# Patient Record
Sex: Male | Born: 1965 | Race: White | Hispanic: No | Marital: Married | Smoking: Former smoker
Health system: Southern US, Community
[De-identification: ages and names within clinical notes are randomized; demographics above are authoritative.]

## PROBLEM LIST (undated history)

## (undated) DIAGNOSIS — F329 Major depressive disorder, single episode, unspecified: Secondary | ICD-10-CM

## (undated) DIAGNOSIS — F32A Depression, unspecified: Secondary | ICD-10-CM

## (undated) DIAGNOSIS — M1990 Unspecified osteoarthritis, unspecified site: Secondary | ICD-10-CM

## (undated) DIAGNOSIS — F419 Anxiety disorder, unspecified: Secondary | ICD-10-CM

## (undated) DIAGNOSIS — Z86010 Personal history of colonic polyps: Secondary | ICD-10-CM

## (undated) HISTORY — PX: UPPER GASTROINTESTINAL ENDOSCOPY: SHX188

## (undated) HISTORY — DX: Personal history of colonic polyps: Z86.010

## (undated) HISTORY — PX: UPPER GI ENDOSCOPY: SHX6162

## (undated) HISTORY — PX: MOUTH SURGERY: SHX715

## (undated) HISTORY — PX: COLONOSCOPY: SHX174

## (undated) HISTORY — PX: REPLACEMENT TOTAL KNEE: SUR1224

---

## 1997-01-18 HISTORY — PX: KNEE ARTHROSCOPY: SUR90

## 1998-03-26 ENCOUNTER — Encounter: Admission: RE | Admit: 1998-03-26 | Discharge: 1998-06-24 | Payer: Self-pay | Admitting: Orthopedic Surgery

## 2003-01-19 HISTORY — PX: NASAL SEPTUM SURGERY: SHX37

## 2004-09-24 ENCOUNTER — Ambulatory Visit: Payer: Self-pay | Admitting: Cardiology

## 2005-05-03 ENCOUNTER — Ambulatory Visit (HOSPITAL_BASED_OUTPATIENT_CLINIC_OR_DEPARTMENT_OTHER): Admission: RE | Admit: 2005-05-03 | Discharge: 2005-05-03 | Payer: Self-pay | Admitting: Orthopedic Surgery

## 2007-08-09 ENCOUNTER — Emergency Department (HOSPITAL_COMMUNITY): Admission: EM | Admit: 2007-08-09 | Discharge: 2007-08-09 | Payer: Self-pay | Admitting: Emergency Medicine

## 2010-06-05 NOTE — Op Note (Signed)
NAME:  Manuel Galloway, Manuel Galloway                  ACCOUNT NO.:  [id number]   MEDICAL RECORD NO.:  [id number]          PATIENT TYPE:  AMB   LOCATION:  DSC                          FACILITY:  [facility]   PHYSICIAN:  Feliberto Gottron. Turner Daniels, M.D.   DATE OF BIRTH:  [birth year]   DATE OF PROCEDURE:  05/03/2005  DATE OF DISCHARGE:                                 OPERATIVE REPORT   PREOPERATIVE DIAGNOSIS:  Left knee chondromalacia patella, possible medial  meniscal tear.   POSTOPERATIVE DIAGNOSIS:  Left knee chondromalacia patella grade 3, trochlea  grade 3 to grade 4, medial femoral condyle grade 3 to grade 4 with a large  posterior flap tear, large parrot beak tear the posterior horn medial  meniscus grade 3 chondromalacia lateral tibial condyle as well as some minor  degenerative tearing of the lateral meniscus.   PROCEDURE:  Left knee arthroscopic debridement chondromalacia and removal of  large parrot beak tear that was coming through the notch of the left knee  and also another finding was a partial tear the ACL.   SURGEON:  Feliberto Gottron. Turner Daniels, MD   FIRST ASSISTANT:  Skip Mayer, PA-C.   ANESTHETIC:  General LMA.   ESTIMATED BLOOD LOSS:  Minimal.   FLUID REPLACEMENT:  700 mL crystalloid.   DRAINS PLACED:  None.   TOURNIQUET TIME:  None.   INDICATIONS FOR PROCEDURE:  45 year old male with 23-month history of  catching, popping and pain in his left knee that is debilitating and he has  also had a chronic effusion for the last couple of weeks despite  conservative treatment.  He desires elective arthroscopic evaluation and  treatment of his left knee.  Risks, benefits of surgery discussed and he is  prepared for surgical intervention.  Questions were answered.   DESCRIPTION OF PROCEDURE:  The patient identified by armband, taken the  operating room at [facility].  Appropriate anesthetic monitors  were attached and general LMA anesthesia induced with the patient in supine  position.  Lateral  post applied to the table, left lower extremity prepped  and draped in a sterile fashion from the ankle to the midthigh and using a  #11 blade standard inferomedial and inferolateral peripatellar portals were  then made allowing introduction of the arthroscope through the inferolateral  portal and the outflow through the inferomedial portal.  Diagnostic  arthroscopy revealed fairly extensive chondromalacia patella grade 3 flap  tears and this was debrided back to stable margin with 3.5 gator sucker  shaver as were similar findings of the trochlea.  Moving in the medial  compartment a large parrot beak tear of the medial meniscus was encountered  and debrided back to stable margin with a 3.5 gator sucker shaver and a  straight biters as were large flap tears of the posterior aspect of the  medial femoral condyle.  The ACL was noted to be somewhat attenuated but  intact.  Going to the lateral side, grade 3 chondromalacia of was also noted  to lateral tibial condyle and this was debrided.  The distal aspect of the  medial  femoral condyle also had some significant chondromalacia grade 3 and  some focal grade 4 and there were multiple cartilaginous loose bodies found  in the joint fluid that were taken through the outflow.  At this point the  knee was irrigated out with normal saline solution.  The arthroscopic  instruments were removed and a dressing of Xeroform, 4x4 dressing sponges,  Webril and Ace wrap applied.  The patient was then awakened and taken to the  recovery room without difficulty.      Feliberto Gottron. Turner Daniels, M.D.  Electronically Signed     FJR/MEDQ  D:  05/03/2005  T:  05/04/2005  Job:  [id number]

## 2011-05-09 ENCOUNTER — Other Ambulatory Visit: Payer: Self-pay | Admitting: Orthopedic Surgery

## 2011-05-09 MED ORDER — DEXAMETHASONE SODIUM PHOSPHATE 10 MG/ML IJ SOLN: 10.0000 mg | Freq: Once | INTRAMUSCULAR | Status: DC

## 2011-05-09 MED ORDER — BUPIVACAINE 0.25 % ON-Q PUMP SINGLE CATH 300ML: 300.0000 mL | INJECTION | Status: DC

## 2011-07-02 ENCOUNTER — Encounter (HOSPITAL_COMMUNITY): Payer: Self-pay | Admitting: Pharmacy Technician

## 2011-07-06 ENCOUNTER — Encounter (HOSPITAL_COMMUNITY): Payer: Self-pay

## 2011-07-06 ENCOUNTER — Encounter (HOSPITAL_COMMUNITY)
Admission: RE | Admit: 2011-07-06 | Discharge: 2011-07-06 | Disposition: A | Discharge status: Home / Self Care | Payer: BC Managed Care – PPO | Source: Ambulatory Visit | Attending: Orthopedic Surgery | Admitting: Orthopedic Surgery

## 2011-07-06 HISTORY — DX: Unspecified osteoarthritis, unspecified site: M19.90

## 2011-07-06 LAB — URINALYSIS, ROUTINE W REFLEX MICROSCOPIC
Ketones, ur: NEGATIVE mg/dL
Leukocytes, UA: NEGATIVE
Nitrite: NEGATIVE
pH: 6.5 (ref 5.0–8.0)

## 2011-07-06 LAB — CBC
MCH: 30.6 pg (ref 26.0–34.0)
MCHC: 35.1 g/dL (ref 30.0–36.0)
MCV: 87 fL (ref 78.0–100.0)
Platelets: 183 10*3/uL (ref 150–400)
RDW: 12.5 % (ref 11.5–15.5)

## 2011-07-06 LAB — COMPREHENSIVE METABOLIC PANEL
AST: 28 U/L (ref 0–37)
Albumin: 4.3 g/dL (ref 3.5–5.2)
Calcium: 9.5 mg/dL (ref 8.4–10.5)
Creatinine, Ser: 0.85 mg/dL (ref 0.50–1.35)
GFR calc non Af Amer: >90 mL/min (ref >90)

## 2011-07-06 LAB — PROTIME-INR: INR: 0.98 (ref 0.00–1.49)

## 2011-07-06 NOTE — Pre-Procedure Instructions (Signed)
Reviewed pre-op instructions using teach back method.

## 2011-07-06 NOTE — Patient Instructions (Addendum)
20 Michaelangelo Mittelman [facility]  07/06/2011   Your procedure is scheduled on:  Wednesday 07/14/2011 at 230pm  Report to [facility] at 12 noon  Call this number if you have problems the morning of surgery: [phone]   Remember:   Do not eat food:After Midnight.  May have clear liquids: up to 4 Hours before arrival-MAY HAVE CLEAR LIQUIDS FROM MIDNIGHT UP UNTIL 830AM THEN NOTHING UNTIL AFTER SURGERY!  Clear liquids include soda, tea, black coffee, apple or grape juice, broth.  Take these medicines the morning of surgery with A SIP OF WATER: Paxil   Do not wear jewelry  Do not wear lotions, powders, or perfumes.  Do not shave 48 hours prior to surgery. Men may shave face and neck.  Do not bring valuables to the hospital.  Contacts, dentures or bridgework may not be worn into surgery.  Leave suitcase in the car. After surgery it may be brought to your room.  For patients admitted to the hospital, checkout time is 11:00 AM the day of discharge.       Special Instructions: CHG Shower Use Special Wash: 1/2 bottle night before surgery and 1/2 bottle morning of surgery.   Please read over the following fact sheets that you were given: MRSA Information, Blood fact sheet, Incentive Spirometry sheet, Sleep apnea sheet

## 2011-07-13 ENCOUNTER — Other Ambulatory Visit: Payer: Self-pay | Admitting: Orthopedic Surgery

## 2011-07-13 NOTE — H&P (Signed)
Manuel Galloway  DOB: 07/07/1965 Married / Language: Undefined / Race: Undefined Male  Date of Admission:  07/13/2011  Chief Complaint:  Left Knee Pain  History of Present Illness The patient is a 46 year old male who comes in for a preoperative History and Physical. The patient is scheduled for a left total knee arthroplasty to be performed by Dr. Frank V. Aluisio, MD at [facility] on 07/14/2011. The patient is a 46 year old male who presents today for follow up of their knee. The patient is being followed for their left knee pain. Symptoms reported today include: pain. The patient feels that they are doing poorly and report their pain level to be mild. Current treatment includes: NSAIDs and cortizone injection. The following medication has been used for pain control: antiinflammatory medication. The patient has reported improvement of their symptoms with: conservative measures and Cortisone injections. The patient indicates that they have questions or concerns today regarding pain and their progress at this point. Note for "Follow-up Knee": cortizone injection. He states he may have had a little improvement from injection although he still has pain. He says the pain radiates laterally down to his foot from the knee. He has no locking or instability. He says that the pain comes after about 20 mins. of walking, standing, etc. They have been treated conservatively in the past for the above stated problem and despite conservative measures, they continue to have progressive pain and severe functional limitations and dysfunction. They have failed non-operative management including home exercise, medications, and injections. It is felt that they would benefit from undergoing total joint replacement. Risks and benefits of the procedure have been discussed with the patient and they elect to proceed with surgery. There are no active contraindications to surgery such as ongoing infection or rapidly  progressive neurological disease.   Problem List/Past Medical Osteoarthrosis NOS, lower leg (715.96). 08/07/2010 Anxiety Disorder Depression Osteoarthritis Eczema  Allergies Morphine Derivatives. Mild itching   Family History Cancer. father   Social History Alcohol use. current drinker; drinks beer; 5-7 per week Children. 3 Current work status. working full time Drug/Alcohol Rehab (Currently). no Drug/Alcohol Rehab (Previously). no Exercise. Exercises rarely; does running / walking Illicit drug use. no Living situation. live with spouse Marital status. married Number of flights of stairs before winded. 4-5 Pain Contract. no Tobacco / smoke exposure. no Tobacco use. Never smoker. former smoker; smoke(d) 1 pack(s) per day Post-Surgical Plans. Plan is to go home after surgery.   Medication History Paxil (10MG Tablet, Oral daily) Active.   Past Surgical History Arthroscopy of Knee. bilateral Straighten Nasal Septum   Review of Systems General:Not Present- Chills, Fever, Night Sweats, Appetite Loss, Fatigue, Feeling sick, Weight Gain and Weight Loss. Skin:Not Present- Itching, Rash, Skin Color Changes, Ulcer, Psoriasis and Change in Hair or Nails. HEENT:Not Present- Sensitivity to light, Hearing problems, Nose Bleed and Ringing in the Ears. Neck:Not Present- Swollen Glands and Neck Mass. Respiratory:Not Present- Snoring, Chronic Cough, Bloody sputum and Dyspnea. Cardiovascular:Present- Leg Cramps. Not Present- Shortness of Breath, Chest Pain, Swelling of Extremities and Palpitations. Gastrointestinal:Not Present- Bloody Stool, Heartburn, Abdominal Pain, Vomiting, Nausea and Incontinence of Stool. Male Genitourinary:Not Present- Blood in Urine, Frequency, Incontinence and Nocturia. Musculoskeletal:Present- Muscle Weakness, Joint Stiffness, Joint Swelling and Joint Pain. Not Present- Muscle Pain and Back Pain. Neurological:Present-  Tingling. Not Present- Numbness, Burning, Tremor, Headaches and Dizziness. Psychiatric:Not Present- Anxiety, Depression and Memory Loss. Endocrine:Not Present- Cold Intolerance, Heat Intolerance, Excessive hunger and Excessive Thirst. Hematology:Not   Present- Abnormal Bleeding, Anemia, Blood Clots and Easy Bruising.   Vitals Weight: 265 lb Height: 75 in Body Surface Area: 2.52 m Body Mass Index: 33.12 kg/m Pulse: 76 (Regular) Resp.: 14 (Unlabored) BP: 118/82 (Sitting, Right Arm, Standard)   Physical Exam The physical exam findings are as follows: Patient is a tall framed male with continued knee pain.   General Mental Status - Alert, cooperative and good historian. General Appearance- pleasant. Not in acute distress. Orientation- Oriented X3. Build & Nutrition- Well nourished and Well developed.   Head and Neck Head- normocephalic, atraumatic . Neck Global Assessment- supple. no bruit auscultated on the right and no bruit auscultated on the left.   Eye Pupil- Bilateral- Regular and Round. Motion- Bilateral- EOMI. wears contacts and glasses  Chest and Lung Exam Auscultation: Breath sounds:- clear at anterior chest wall and - clear at posterior chest wall. Adventitious sounds:- No Adventitious sounds.   Cardiovascular Auscultation:Rhythm- Regular rate and rhythm. Heart Sounds- S1 WNL and S2 WNL. Murmurs & Other Heart Sounds:Auscultation of the heart reveals - No Murmurs.   Abdomen Palpation/Percussion:Tenderness- Abdomen is non-tender to palpation. Rigidity (guarding)- Abdomen is soft. Auscultation:Auscultation of the abdomen reveals - Bowel sounds normal.   Male Genitourinary Not done, not pertinent to present illness  Musculoskeletal Both hips have normal range of motion with no discomfort.  Right knee shows no effusion. There is no deformity on the right. Range of motion is five to 125 degrees. Slight crepitus on  range of motion. There is no instability or tenderness.  Left knee shows no effusion. Range of motion is 10 to 120 degrees. Marked crepitus on range of motion. Tenderness medial greater than lateral. There is no instability noted. Pulses, sensation and motor are intact.  RADIOGRAPHS: Radiographs are reviewed from last visit AP and lateral of the knee showing severe endstage tricompartmental arthritis of the left knee bone-on-bone all three compartments worse lateral and patellofemoral. There is no significant angular deformity but all three compartments are involved.  Assessment & Plan Osteoarthritis Left Knee  Note: Patient is for a Left Total Knee Replacement for Dr. Aluisio.  Plan is to go home after the hospital stay.  Signed electronically by DREW L Anden Bartolo, PA-C  

## 2011-07-14 ENCOUNTER — Inpatient Hospital Stay (HOSPITAL_COMMUNITY)
Admission: RE | Admit: 2011-07-14 | Discharge: 2011-07-16 | DRG: 209 | Disposition: A | Discharge status: Home Health | Payer: BC Managed Care – PPO | Source: Ambulatory Visit | Attending: Orthopedic Surgery | Admitting: Orthopedic Surgery

## 2011-07-14 ENCOUNTER — Ambulatory Visit (HOSPITAL_COMMUNITY): Payer: BC Managed Care – PPO | Admitting: *Deleted

## 2011-07-14 ENCOUNTER — Encounter (HOSPITAL_COMMUNITY): Payer: Self-pay | Admitting: *Deleted

## 2011-07-14 ENCOUNTER — Encounter (HOSPITAL_COMMUNITY)
Admission: RE | Disposition: A | Discharge status: Home Health | Payer: Self-pay | Source: Ambulatory Visit | Attending: Orthopedic Surgery

## 2011-07-14 DIAGNOSIS — Z96659 Presence of unspecified artificial knee joint: Secondary | ICD-10-CM

## 2011-07-14 DIAGNOSIS — E876 Hypokalemia: Secondary | ICD-10-CM

## 2011-07-14 DIAGNOSIS — Z01812 Encounter for preprocedural laboratory examination: Secondary | ICD-10-CM

## 2011-07-14 DIAGNOSIS — E871 Hypo-osmolality and hyponatremia: Secondary | ICD-10-CM

## 2011-07-14 DIAGNOSIS — M1710 Unilateral primary osteoarthritis, unspecified knee: Secondary | ICD-10-CM | POA: Diagnosis present

## 2011-07-14 HISTORY — PX: TOTAL KNEE ARTHROPLASTY: SHX125

## 2011-07-14 LAB — TYPE AND SCREEN

## 2011-07-14 SURGERY — ARTHROPLASTY, KNEE, TOTAL
Anesthesia: General | Site: Knee | Laterality: Left | Wound class: Clean

## 2011-07-14 MED ORDER — MORPHINE SULFATE (PF) 1 MG/ML IV SOLN
INTRAVENOUS | Status: AC
  Administered 2011-07-14: 16:00:00
  Filled 2011-07-14: qty 25

## 2011-07-14 MED ORDER — MORPHINE SULFATE (PF) 1 MG/ML IV SOLN
INTRAVENOUS | Status: DC
  Administered 2011-07-14: 19 mg via INTRAVENOUS
  Administered 2011-07-14: 20:00:00 via INTRAVENOUS
  Administered 2011-07-15: 13 mg via INTRAVENOUS
  Administered 2011-07-15: 1 mg via INTRAVENOUS
  Filled 2011-07-14: qty 25

## 2011-07-14 MED ORDER — ONDANSETRON HCL 4 MG PO TABS: 4.0000 mg | ORAL_TABLET | Freq: Four times a day (QID) | ORAL | Status: DC | PRN

## 2011-07-14 MED ORDER — CEFAZOLIN SODIUM-DEXTROSE 2-3 GM-% IV SOLR
2.0000 g | INTRAVENOUS | Status: AC
  Administered 2011-07-14: 2 g via INTRAVENOUS

## 2011-07-14 MED ORDER — BUPIVACAINE 0.25 % ON-Q PUMP SINGLE CATH 300ML
INJECTION | Status: AC
  Filled 2011-07-14: qty 300

## 2011-07-14 MED ORDER — ACETAMINOPHEN 10 MG/ML IV SOLN
1000.0000 mg | Freq: Four times a day (QID) | INTRAVENOUS | Status: AC
  Administered 2011-07-14 – 2011-07-15 (×4): 1000 mg via INTRAVENOUS
  Filled 2011-07-14 (×4): qty 100

## 2011-07-14 MED ORDER — HYDROMORPHONE HCL PF 1 MG/ML IJ SOLN
INTRAMUSCULAR | Status: AC
  Filled 2011-07-14: qty 1

## 2011-07-14 MED ORDER — SODIUM CHLORIDE 0.9 % IJ SOLN: 9.0000 mL | INTRAMUSCULAR | Status: DC | PRN

## 2011-07-14 MED ORDER — CEFAZOLIN SODIUM-DEXTROSE 2-3 GM-% IV SOLR
INTRAVENOUS | Status: AC
  Filled 2011-07-14: qty 50

## 2011-07-14 MED ORDER — METHOCARBAMOL 100 MG/ML IJ SOLN
500.0000 mg | Freq: Four times a day (QID) | INTRAVENOUS | Status: DC | PRN
  Administered 2011-07-14 (×2): 500 mg via INTRAVENOUS
  Filled 2011-07-14 (×2): qty 5

## 2011-07-14 MED ORDER — ONDANSETRON HCL 4 MG/2ML IJ SOLN
4.0000 mg | Freq: Four times a day (QID) | INTRAMUSCULAR | Status: DC | PRN
Start: 2011-07-14 — End: 2011-07-14

## 2011-07-14 MED ORDER — DIPHENHYDRAMINE HCL 50 MG/ML IJ SOLN: 12.5000 mg | Freq: Four times a day (QID) | INTRAMUSCULAR | Status: DC | PRN

## 2011-07-14 MED ORDER — MENTHOL 3 MG MT LOZG
1.0000 | LOZENGE | OROMUCOSAL | Status: DC | PRN
  Filled 2011-07-14: qty 9

## 2011-07-14 MED ORDER — MIDAZOLAM HCL 5 MG/5ML IJ SOLN
INTRAMUSCULAR | Status: DC | PRN
  Administered 2011-07-14: 2 mg via INTRAVENOUS

## 2011-07-14 MED ORDER — TRAMADOL HCL 50 MG PO TABS
50.0000 mg | ORAL_TABLET | Freq: Four times a day (QID) | ORAL | Status: DC | PRN
  Administered 2011-07-15: 100 mg via ORAL
  Filled 2011-07-14: qty 2

## 2011-07-14 MED ORDER — DEXTROSE-NACL 5-0.9 % IV SOLN
INTRAVENOUS | Status: DC
  Administered 2011-07-15: 01:00:00 via INTRAVENOUS

## 2011-07-14 MED ORDER — DIPHENHYDRAMINE HCL 12.5 MG/5ML PO ELIX: 12.5000 mg | ORAL_SOLUTION | Freq: Four times a day (QID) | ORAL | Status: DC | PRN

## 2011-07-14 MED ORDER — NALOXONE HCL 0.4 MG/ML IJ SOLN: 0.4000 mg | INTRAMUSCULAR | Status: DC | PRN

## 2011-07-14 MED ORDER — ACETAMINOPHEN 325 MG PO TABS: 650.0000 mg | ORAL_TABLET | Freq: Four times a day (QID) | ORAL | Status: DC | PRN

## 2011-07-14 MED ORDER — HYDROMORPHONE HCL PF 1 MG/ML IJ SOLN
0.2500 mg | INTRAMUSCULAR | Status: DC | PRN
  Administered 2011-07-14 (×4): 0.5 mg via INTRAVENOUS

## 2011-07-14 MED ORDER — PROPOFOL 10 MG/ML IV BOLUS
INTRAVENOUS | Status: DC | PRN
  Administered 2011-07-14 (×4): 20 mg via INTRAVENOUS

## 2011-07-14 MED ORDER — POLYETHYLENE GLYCOL 3350 17 G PO PACK: 17.0000 g | PACK | Freq: Every day | ORAL | Status: DC | PRN

## 2011-07-14 MED ORDER — FENTANYL CITRATE 0.05 MG/ML IJ SOLN
INTRAMUSCULAR | Status: DC | PRN
  Administered 2011-07-14: 100 ug via INTRAVENOUS
  Administered 2011-07-14 (×3): 50 ug via INTRAVENOUS

## 2011-07-14 MED ORDER — ACETAMINOPHEN 10 MG/ML IV SOLN
1000.0000 mg | Freq: Once | INTRAVENOUS | Status: AC
  Administered 2011-07-14: 1000 mg via INTRAVENOUS

## 2011-07-14 MED ORDER — ONDANSETRON HCL 4 MG/2ML IJ SOLN
INTRAMUSCULAR | Status: DC | PRN
  Administered 2011-07-14: 4 mg via INTRAVENOUS

## 2011-07-14 MED ORDER — ACETAMINOPHEN 10 MG/ML IV SOLN
INTRAVENOUS | Status: AC
  Filled 2011-07-14: qty 100

## 2011-07-14 MED ORDER — LACTATED RINGERS IV SOLN
INTRAVENOUS | Status: DC
  Administered 2011-07-14 (×2): via INTRAVENOUS
  Administered 2011-07-14: 1000 mL via INTRAVENOUS

## 2011-07-14 MED ORDER — DEXAMETHASONE SODIUM PHOSPHATE 4 MG/ML IJ SOLN
INTRAMUSCULAR | Status: DC | PRN
  Administered 2011-07-14: 10 mg via INTRAVENOUS

## 2011-07-14 MED ORDER — BUPIVACAINE IN DEXTROSE 0.75-8.25 % IT SOLN
INTRATHECAL | Status: DC | PRN
  Administered 2011-07-14: 10 mg via INTRATHECAL

## 2011-07-14 MED ORDER — METOCLOPRAMIDE HCL 10 MG PO TABS: 5.0000 mg | ORAL_TABLET | Freq: Three times a day (TID) | ORAL | Status: DC | PRN

## 2011-07-14 MED ORDER — TEMAZEPAM 15 MG PO CAPS
15.0000 mg | ORAL_CAPSULE | Freq: Every evening | ORAL | Status: DC | PRN
  Administered 2011-07-14: 15 mg via ORAL
  Filled 2011-07-14: qty 1

## 2011-07-14 MED ORDER — CHLORHEXIDINE GLUCONATE 4 % EX LIQD: 60.0000 mL | Freq: Once | CUTANEOUS | Status: DC

## 2011-07-14 MED ORDER — SODIUM CHLORIDE 0.9 % IR SOLN
Status: DC | PRN
  Administered 2011-07-14: 1000 mL

## 2011-07-14 MED ORDER — PROPOFOL 10 MG/ML IV EMUL
INTRAVENOUS | Status: DC | PRN
  Administered 2011-07-14: 100 ug/kg/min via INTRAVENOUS

## 2011-07-14 MED ORDER — BUPIVACAINE HCL (PF) 0.25 % IJ SOLN
INTRAMUSCULAR | Status: DC | PRN
  Administered 2011-07-14: 300 mL via INTRA_ARTICULAR

## 2011-07-14 MED ORDER — METHOCARBAMOL 500 MG PO TABS
500.0000 mg | ORAL_TABLET | Freq: Four times a day (QID) | ORAL | Status: DC | PRN
  Administered 2011-07-15 – 2011-07-16 (×5): 500 mg via ORAL
  Filled 2011-07-14 (×5): qty 1

## 2011-07-14 MED ORDER — SODIUM CHLORIDE 0.9 % IV SOLN: INTRAVENOUS | Status: DC

## 2011-07-14 MED ORDER — ACETAMINOPHEN 650 MG RE SUPP: 650.0000 mg | Freq: Four times a day (QID) | RECTAL | Status: DC | PRN

## 2011-07-14 MED ORDER — METOCLOPRAMIDE HCL 5 MG/ML IJ SOLN: 5.0000 mg | Freq: Three times a day (TID) | INTRAMUSCULAR | Status: DC | PRN

## 2011-07-14 MED ORDER — DOCUSATE SODIUM 100 MG PO CAPS
100.0000 mg | ORAL_CAPSULE | Freq: Two times a day (BID) | ORAL | Status: DC
  Administered 2011-07-14 – 2011-07-16 (×4): 100 mg via ORAL

## 2011-07-14 MED ORDER — FLEET ENEMA 7-19 GM/118ML RE ENEM: 1.0000 | ENEMA | Freq: Once | RECTAL | Status: AC | PRN

## 2011-07-14 MED ORDER — CEFAZOLIN SODIUM 1-5 GM-% IV SOLN
1.0000 g | Freq: Four times a day (QID) | INTRAVENOUS | Status: AC
  Administered 2011-07-14 – 2011-07-15 (×2): 1 g via INTRAVENOUS
  Filled 2011-07-14 (×2): qty 50

## 2011-07-14 MED ORDER — RIVAROXABAN 10 MG PO TABS
10.0000 mg | ORAL_TABLET | Freq: Every day | ORAL | Status: DC
  Administered 2011-07-15 – 2011-07-16 (×2): 10 mg via ORAL
  Filled 2011-07-14 (×3): qty 1

## 2011-07-14 MED ORDER — PHENOL 1.4 % MT LIQD
1.0000 | OROMUCOSAL | Status: DC | PRN
  Filled 2011-07-14: qty 177

## 2011-07-14 MED ORDER — DIPHENHYDRAMINE HCL 12.5 MG/5ML PO ELIX
12.5000 mg | ORAL_SOLUTION | ORAL | Status: DC | PRN
  Administered 2011-07-14 – 2011-07-15 (×2): 25 mg via ORAL
  Administered 2011-07-15: 12.5 mg via ORAL
  Administered 2011-07-15: 25 mg via ORAL
  Administered 2011-07-15 – 2011-07-16 (×2): 12.5 mg via ORAL
  Administered 2011-07-16: 25 mg via ORAL
  Filled 2011-07-14 (×2): qty 10
  Filled 2011-07-14: qty 5
  Filled 2011-07-14 (×2): qty 10
  Filled 2011-07-14: qty 5
  Filled 2011-07-14: qty 10

## 2011-07-14 MED ORDER — PAROXETINE HCL 20 MG PO TABS
20.0000 mg | ORAL_TABLET | Freq: Every day | ORAL | Status: DC
  Administered 2011-07-15 – 2011-07-16 (×2): 20 mg via ORAL
  Filled 2011-07-14 (×2): qty 1

## 2011-07-14 MED ORDER — ONDANSETRON HCL 4 MG/2ML IJ SOLN: 4.0000 mg | Freq: Four times a day (QID) | INTRAMUSCULAR | Status: DC | PRN

## 2011-07-14 MED ORDER — BISACODYL 10 MG RE SUPP: 10.0000 mg | Freq: Every day | RECTAL | Status: DC | PRN

## 2011-07-14 MED ORDER — OXYCODONE HCL 5 MG PO TABS
5.0000 mg | ORAL_TABLET | ORAL | Status: DC | PRN
  Administered 2011-07-14 – 2011-07-16 (×13): 10 mg via ORAL
  Filled 2011-07-14 (×13): qty 2

## 2011-07-14 MED ORDER — BUPIVACAINE ON-Q PAIN PUMP (FOR ORDER SET NO CHG)
INJECTION | Status: DC
  Filled 2011-07-14: qty 1

## 2011-07-14 SURGICAL SUPPLY — 59 items
BAG SPEC THK2 15X12 ZIP CLS (MISCELLANEOUS) ×1
BAG ZIPLOCK 12X15 (MISCELLANEOUS) ×2 IMPLANT
BANDAGE ELASTIC 6 VELCRO ST LF (GAUZE/BANDAGES/DRESSINGS) ×2 IMPLANT
BANDAGE ESMARK 6X9 LF (GAUZE/BANDAGES/DRESSINGS) ×1 IMPLANT
BLADE SAG 18X100X1.27 (BLADE) ×2 IMPLANT
BLADE SAW SGTL 11.0X1.19X90.0M (BLADE) ×2 IMPLANT
BNDG CMPR 9X6 STRL LF SNTH (GAUZE/BANDAGES/DRESSINGS) ×1
BNDG ESMARK 6X9 LF (GAUZE/BANDAGES/DRESSINGS) ×2
BOWL SMART MIX CTS (DISPOSABLE) ×2 IMPLANT
CATH KIT ON-Q SILVERSOAK 5 (CATHETERS) ×1 IMPLANT
CATH KIT ON-Q SILVERSOAK 5IN (CATHETERS) ×2 IMPLANT
CEMENT HV SMART SET (Cement) ×4 IMPLANT
CLOSURE STERI-STRIP 1/4X4 (GAUZE/BANDAGES/DRESSINGS) ×1 IMPLANT
CLOTH BEACON ORANGE TIMEOUT ST (SAFETY) ×2 IMPLANT
CUFF TOURN SGL QUICK 34 (TOURNIQUET CUFF) ×2
CUFF TRNQT CYL 34X4X40X1 (TOURNIQUET CUFF) ×1 IMPLANT
DRAPE EXTREMITY T 121X128X90 (DRAPE) ×2 IMPLANT
DRAPE POUCH INSTRU U-SHP 10X18 (DRAPES) ×2 IMPLANT
DRAPE U-SHAPE 47X51 STRL (DRAPES) ×2 IMPLANT
DRSG ADAPTIC 3X8 NADH LF (GAUZE/BANDAGES/DRESSINGS) ×2 IMPLANT
DRSG PAD ABDOMINAL 8X10 ST (GAUZE/BANDAGES/DRESSINGS) ×1 IMPLANT
DURAPREP 26ML APPLICATOR (WOUND CARE) ×2 IMPLANT
ELECT REM PT RETURN 9FT ADLT (ELECTROSURGICAL) ×2
ELECTRODE REM PT RTRN 9FT ADLT (ELECTROSURGICAL) ×1 IMPLANT
EVACUATOR 1/8 PVC DRAIN (DRAIN) ×2 IMPLANT
FACESHIELD LNG OPTICON STERILE (SAFETY) ×10 IMPLANT
GLOVE BIO SURGEON STRL SZ7.5 (GLOVE) ×4 IMPLANT
GLOVE BIO SURGEON STRL SZ8 (GLOVE) ×2 IMPLANT
GLOVE BIOGEL PI IND STRL 7.5 (GLOVE) IMPLANT
GLOVE BIOGEL PI IND STRL 8 (GLOVE) ×2 IMPLANT
GLOVE BIOGEL PI INDICATOR 7.5 (GLOVE) ×2
GLOVE BIOGEL PI INDICATOR 8 (GLOVE) ×2
GOWN STRL NON-REIN LRG LVL3 (GOWN DISPOSABLE) ×4 IMPLANT
GOWN STRL REIN XL XLG (GOWN DISPOSABLE) ×2 IMPLANT
HANDPIECE INTERPULSE COAX TIP (DISPOSABLE) ×2
IMMOBILIZER KNEE 20 (SOFTGOODS) ×2
IMMOBILIZER KNEE 20 THIGH 36 (SOFTGOODS) ×1 IMPLANT
KIT BASIN OR (CUSTOM PROCEDURE TRAY) ×2 IMPLANT
MANIFOLD NEPTUNE II (INSTRUMENTS) ×2 IMPLANT
NS IRRIG 1000ML POUR BTL (IV SOLUTION) ×2 IMPLANT
PACK TOTAL JOINT (CUSTOM PROCEDURE TRAY) ×2 IMPLANT
PAD ABD 7.5X8 STRL (GAUZE/BANDAGES/DRESSINGS) ×2 IMPLANT
PADDING CAST ABS 6INX4YD NS (CAST SUPPLIES) ×1
PADDING CAST ABS COTTON 6X4 NS (CAST SUPPLIES) IMPLANT
PADDING CAST COTTON 6X4 STRL (CAST SUPPLIES) ×6 IMPLANT
POSITIONER SURGICAL ARM (MISCELLANEOUS) ×2 IMPLANT
SET HNDPC FAN SPRY TIP SCT (DISPOSABLE) ×1 IMPLANT
SPONGE GAUZE 4X4 12PLY (GAUZE/BANDAGES/DRESSINGS) ×2 IMPLANT
STRIP CLOSURE SKIN 1/2X4 (GAUZE/BANDAGES/DRESSINGS) ×4 IMPLANT
SUCTION FRAZIER 12FR DISP (SUCTIONS) ×2 IMPLANT
SUT MNCRL AB 4-0 PS2 18 (SUTURE) ×2 IMPLANT
SUT PDS AB 1 CT1 27 (SUTURE) ×6 IMPLANT
SUT VIC AB 2-0 CT1 27 (SUTURE) ×6
SUT VIC AB 2-0 CT1 TAPERPNT 27 (SUTURE) ×3 IMPLANT
SUT VLOC 180 0 24IN GS25 (SUTURE) ×2 IMPLANT
TOWEL OR 17X26 10 PK STRL BLUE (TOWEL DISPOSABLE) ×4 IMPLANT
TRAY FOLEY CATH 14FRSI W/METER (CATHETERS) ×2 IMPLANT
WATER STERILE IRR 1500ML POUR (IV SOLUTION) ×2 IMPLANT
WRAP KNEE MAXI GEL POST OP (GAUZE/BANDAGES/DRESSINGS) ×4 IMPLANT

## 2011-07-14 NOTE — Interval H&P Note (Signed)
History and Physical Interval Note:  07/14/2011 1:43 PM  Manuel Galloway  has presented today for surgery, with the diagnosis of Osteoarthritis of the Left Knee  The various methods of treatment have been discussed with the patient and family. After consideration of risks, benefits and other options for treatment, the patient has consented to  Procedure(s) (LRB): TOTAL KNEE ARTHROPLASTY (Left) as a surgical intervention .  The patient's history has been reviewed, patient examined, no change in status, stable for surgery.  I have reviewed the patients' chart and labs.  Questions were answered to the patient's satisfaction.     Loanne Drilling

## 2011-07-14 NOTE — Anesthesia Postprocedure Evaluation (Signed)
  Anesthesia Post-op Note  Patient: Manuel Galloway  Procedure(s) Performed: Procedure(s) (LRB): TOTAL KNEE ARTHROPLASTY (Left)  Patient Location: PACU  Anesthesia Type: Spinal  Level of Consciousness: oriented and sedated  Airway and Oxygen Therapy: Patient Spontanous Breathing and Patient connected to nasal cannula oxygen  Post-op Pain: mild  Post-op Assessment: Post-op Vital signs reviewed, Patient's Cardiovascular Status Stable, Respiratory Function Stable and Patent Airway  Post-op Vital Signs: stable  Complications: No apparent anesthesia complications

## 2011-07-14 NOTE — Progress Notes (Signed)
Patient states he wants something to help him sleep tonight. On call PA Kriste Basque paged with orders to give Restoril 15mg   qHS PRN for sleep.

## 2011-07-14 NOTE — OR Nursing (Signed)
Pre-procedural time performed at 1345

## 2011-07-14 NOTE — Op Note (Signed)
Pre-operative diagnosis- Osteoarthritis  Left knee(s)  Post-operative diagnosis- Osteoarthritis Left knee(s)  Procedure-  Left  Total Knee Arthroplasty  Surgeon- Gus Rankin. Neelie Welshans, MD  Assistant- Avel Peace, PA-C   Anesthesia-  Spinal EBL-* No blood loss amount entered *  Drains Hemovac  Tourniquet time-  Total Tourniquet Time Documented: Thigh (Left) - 41 minutes   Complications- None  Condition-PACU - hemodynamically stable.   Brief Clinical Note   Manuel Galloway is a 46 y.o. year old male with end stage OA of his left knee with progressively worsening pain and dysfunction. He has constant pain, with activity and at rest and significant functional deficits with difficulties even with ADLs. He has had extensive non-op management including analgesics, injections of cortisone and viscosupplements, and home exercise program, but remains in significant pain with significant dysfunction. Radiographs show bone on bone arthritis lateral and patellofemoral with tibial subluxation. He presents now for left Total Knee Arthroplasty.    Procedure in detail---   The patient is brought into the operating room and positioned supine on the operating table. After successful administration of  Spinal,   a tourniquet is placed high on the  Left thigh(s) and the lower extremity is prepped and draped in the usual sterile fashion. Time out is performed by the operating team and then the  Left lower extremity is wrapped in Esmarch, knee flexed and the tourniquet inflated to 300 mmHg.       A midline incision is made with a ten blade through the subcutaneous tissue to the level of the extensor mechanism. A fresh blade is used to make a medial parapatellar arthrotomy. Soft tissue over the proximal medial tibia is subperiosteally elevated to the joint line with a knife and into the semimembranosus bursa with a Cobb elevator. Soft tissue over the proximal lateral tibia is elevated with attention being paid to  avoiding the patellar tendon on the tibial tubercle. The patella is everted, knee flexed 90 degrees and the ACL and PCL are removed. Findings are bone on bone all 3 compartments with massive global osteophytes.        The drill is used to create a starting hole in the distal femur and the canal is thoroughly irrigated with sterile saline to remove the fatty contents. The 5 degree Left  valgus alignment guide is placed into the femoral canal and the distal femoral cutting block is pinned to remove 11 mm off the distal femur. Resection is made with an oscillating saw.      The tibia is subluxed forward and the menisci are removed. The extramedullary alignment guide is placed referencing proximally at the medial aspect of the tibial tubercle and distally along the second metatarsal axis and tibial crest. The block is pinned to remove 2mm off the more deficient lateral  side. Resection is made with an oscillating saw. Size 5is the most appropriate size for the tibia and the proximal tibia is prepared with the modular drill and keel punch for that size.      The femoral sizing guide is placed and size 6 is most appropriate. Rotation is marked off the epicondylar axis and confirmed by creating a rectangular flexion gap at 90 degrees. The size 6 cutting block is pinned in this rotation and the anterior, posterior and chamfer cuts are made with the oscillating saw. The intercondylar block is then placed and that cut is made.      Trial size 5 tibial component, trial size 6 posterior stabilized femur  and a 10  mm posterior stabilized rotating platform insert trial is placed. Full extension is achieved with excellent varus/valgus and anterior/posterior balance throughout full range of motion. The patella is everted and thickness measured to be 27  mm. Free hand resection is taken to 15 mm, a 41 template is placed, lug holes are drilled, trial patella is placed, and it tracks normally. Osteophytes are removed off the  posterior femur with the trial in place. All trials are removed and the cut bone surfaces prepared with pulsatile lavage. Cement is mixed and once ready for implantation, the size 5 tibial implant, size  6 posterior stabilized femoral component, and the size 41 patella are cemented in place and the patella is held with the clamp. The trial insert is placed and the knee held in full extension. All extruded cement is removed and once the cement is hard the permanent 10 mm posterior stabilized rotating platform insert is placed into the tibial tray.      The wound is copiously irrigated with saline solution and the extensor mechanism closed over a hemovac drain with #1 PDS suture. The tourniquet is released for a total tourniquet time of 41  minutes. Flexion against gravity is 140 degrees and the patella tracks normally. Subcutaneous tissue is closed with 2.0 vicryl and subcuticular with running 4.0 Monocryl. The catheter for the Marcaine pain pump is placed and the pump is initiated. The incision is cleaned and dried and steri-strips and a bulky sterile dressing are applied. The limb is placed into a knee immobilizer and the patient is awakened and transported to recovery in stable condition.      Please note that a surgical assistant was a medical necessity for this procedure in order to perform it in a safe and expeditious manner. Surgical assistant was necessary to retract the ligaments and vital neurovascular structures to prevent injury to them and also necessary for proper positioning of the limb to allow for anatomic placement of the prosthesis.   Gus Rankin Roshni Burbano, MD    07/14/2011, 3:02 PM

## 2011-07-14 NOTE — Transfer of Care (Signed)
Immediate Anesthesia Transfer of Care Note  Patient: Manuel Galloway [facility]  Procedure(s) Performed: Procedure(s) (LRB): TOTAL KNEE ARTHROPLASTY (Left)  Patient Location: PACU  Anesthesia Type: MAC and Spinal  Level of Consciousness: awake, alert  and patient cooperative  Airway & Oxygen Therapy: Patient Spontanous Breathing and Patient connected to face mask oxygen  Post-op Assessment: Report given to PACU RN, Post -op Vital signs reviewed and stable and Patient moving all extremities  Post vital signs: Reviewed and stable  Complications: No apparent anesthesia complications

## 2011-07-14 NOTE — Anesthesia Procedure Notes (Signed)
Spinal  Patient location during procedure: OR Start time: 07/14/2011 1:47 PM End time: 07/14/2011 1:57 PM Staffing Anesthesiologist: Lestine Box B Preanesthetic Checklist Completed: patient identified, site marked, surgical consent, pre-op evaluation, timeout performed, IV checked, risks and benefits discussed and monitors and equipment checked Spinal Block Patient position: sitting Prep: Betadine and site prepped and draped Patient monitoring: heart rate, cardiac monitor, continuous pulse ox and blood pressure Approach: midline Location: L3-4 Injection technique: single-shot Needle Needle type: Quincke  Needle gauge: 25 G Needle length: 10 cm Needle insertion depth: 4 cm Assessment Sensory level: T6 Additional Notes [phone]

## 2011-07-14 NOTE — Preoperative (Signed)
Beta Blockers   Reason not to administer Beta Blockers:Not Applicable 

## 2011-07-14 NOTE — Anesthesia Preprocedure Evaluation (Signed)
Anesthesia Evaluation  Patient identified by MRN, date of birth, ID band Patient awake    Reviewed: Allergy & Precautions, H&P , NPO status , Patient's Chart, lab work & pertinent test results, reviewed documented beta blocker date and time   Airway Mallampati: I TM Distance: >3 FB Neck ROM: Full    Dental  (+) Teeth Intact and Dental Advisory Given   Pulmonary neg pulmonary ROS,  breath sounds clear to auscultation        Cardiovascular negative cardio ROS  Rhythm:Regular Rate:Normal  Denies cardiac symptoms   Neuro/Psych negative neurological ROS  negative psych ROS   GI/Hepatic negative GI ROS, Neg liver ROS,   Endo/Other  negative endocrine ROS  Renal/GU negative Renal ROS  negative genitourinary   Musculoskeletal   Abdominal   Peds negative pediatric ROS (+)  Hematology negative hematology ROS (+)   Anesthesia Other Findings   Reproductive/Obstetrics negative OB ROS                           Anesthesia Physical Anesthesia Plan  ASA: I  Anesthesia Plan: General   Post-op Pain Management:    Induction: Intravenous  Airway Management Planned: Oral ETT  Additional Equipment:   Intra-op Plan:   Post-operative Plan: Extubation in OR  Informed Consent: I have reviewed the patients History and Physical, chart, labs and discussed the procedure including the risks, benefits and alternatives for the proposed anesthesia with the patient or authorized representative who has indicated his/her understanding and acceptance.   Dental advisory given  Plan Discussed with: CRNA and Surgeon  Anesthesia Plan Comments:         Anesthesia Quick Evaluation

## 2011-07-14 NOTE — H&P (View-Only) (Signed)
Manuel Galloway  DOB: 1965/05/26 Married / Language: Undefined / Race: Undefined Male  Date of Admission:  07/13/2011  Chief Complaint:  Left Knee Pain  History of Present Illness The patient is a 46 year old male who comes in for a preoperative History and Physical. The patient is scheduled for a left total knee arthroplasty to be performed by Dr. Gus Rankin. Aluisio, MD at [facility] on 07/14/2011. The patient is a 46 year old male who presents today for follow up of their knee. The patient is being followed for their left knee pain. Symptoms reported today include: pain. The patient feels that they are doing poorly and report their pain level to be mild. Current treatment includes: NSAIDs and cortizone injection. The following medication has been used for pain control: antiinflammatory medication. The patient has reported improvement of their symptoms with: conservative measures and Cortisone injections. The patient indicates that they have questions or concerns today regarding pain and their progress at this point. Note for "Follow-up Knee": cortizone injection. He states he may have had a little improvement from injection although he still has pain. He says the pain radiates laterally down to his foot from the knee. He has no locking or instability. He says that the pain comes after about 20 mins. of walking, standing, etc. They have been treated conservatively in the past for the above stated problem and despite conservative measures, they continue to have progressive pain and severe functional limitations and dysfunction. They have failed non-operative management including home exercise, medications, and injections. It is felt that they would benefit from undergoing total joint replacement. Risks and benefits of the procedure have been discussed with the patient and they elect to proceed with surgery. There are no active contraindications to surgery such as ongoing infection or rapidly  progressive neurological disease.   Problem List/Past Medical Osteoarthrosis NOS, lower leg (715.96). 08/07/2010 Anxiety Disorder Depression Osteoarthritis Eczema  Allergies Morphine Derivatives. Mild itching   Family History Cancer. father   Social History Alcohol use. current drinker; drinks beer; 5-7 per week Children. 3 Current work status. working full time Drug/Alcohol Rehab (Currently). no Drug/Alcohol Rehab (Previously). no Exercise. Exercises rarely; does running / walking Illicit drug use. no Living situation. live with spouse Marital status. married Number of flights of stairs before winded. 4-5 Pain Contract. no Tobacco / smoke exposure. no Tobacco use. Never smoker. former smoker; smoke(d) 1 pack(s) per day Post-Surgical Plans. Plan is to go home after surgery.   Medication History Paxil (10MG  Tablet, Oral daily) Active.   Past Surgical History Arthroscopy of Knee. bilateral Straighten Nasal Septum   Review of Systems General:Not Present- Chills, Fever, Night Sweats, Appetite Loss, Fatigue, Feeling sick, Weight Gain and Weight Loss. Skin:Not Present- Itching, Rash, Skin Color Changes, Ulcer, Psoriasis and Change in Hair or Nails. HEENT:Not Present- Sensitivity to light, Hearing problems, Nose Bleed and Ringing in the Ears. Neck:Not Present- Swollen Glands and Neck Mass. Respiratory:Not Present- Snoring, Chronic Cough, Bloody sputum and Dyspnea. Cardiovascular:Present- Leg Cramps. Not Present- Shortness of Breath, Chest Pain, Swelling of Extremities and Palpitations. Gastrointestinal:Not Present- Bloody Stool, Heartburn, Abdominal Pain, Vomiting, Nausea and Incontinence of Stool. Male Genitourinary:Not Present- Blood in Urine, Frequency, Incontinence and Nocturia. Musculoskeletal:Present- Muscle Weakness, Joint Stiffness, Joint Swelling and Joint Pain. Not Present- Muscle Pain and Back Pain. Neurological:Present-  Tingling. Not Present- Numbness, Burning, Tremor, Headaches and Dizziness. Psychiatric:Not Present- Anxiety, Depression and Memory Loss. Endocrine:Not Present- Cold Intolerance, Heat Intolerance, Excessive hunger and Excessive Thirst. Hematology:Not  Present- Abnormal Bleeding, Anemia, Blood Clots and Easy Bruising.   Vitals Weight: 265 lb Height: 75 in Body Surface Area: 2.52 m Body Mass Index: 33.12 kg/m Pulse: 76 (Regular) Resp.: 14 (Unlabored) BP: 118/82 (Sitting, Right Arm, Standard)   Physical Exam The physical exam findings are as follows: Patient is a tall framed male with continued knee pain.   General Mental Status - Alert, cooperative and good historian. General Appearance- pleasant. Not in acute distress. Orientation- Oriented X3. Build & Nutrition- Well nourished and Well developed.   Head and Neck Head- normocephalic, atraumatic . Neck Global Assessment- supple. no bruit auscultated on the right and no bruit auscultated on the left.   Eye Pupil- Bilateral- Regular and Round. Motion- Bilateral- EOMI. wears contacts and glasses  Chest and Lung Exam Auscultation: Breath sounds:- clear at anterior chest wall and - clear at posterior chest wall. Adventitious sounds:- No Adventitious sounds.   Cardiovascular Auscultation:Rhythm- Regular rate and rhythm. Heart Sounds- S1 WNL and S2 WNL. Murmurs & Other Heart Sounds:Auscultation of the heart reveals - No Murmurs.   Abdomen Palpation/Percussion:Tenderness- Abdomen is non-tender to palpation. Rigidity (guarding)- Abdomen is soft. Auscultation:Auscultation of the abdomen reveals - Bowel sounds normal.   Male Genitourinary Not done, not pertinent to present illness  Musculoskeletal Both hips have normal range of motion with no discomfort.  Right knee shows no effusion. There is no deformity on the right. Range of motion is five to 125 degrees. Slight crepitus on  range of motion. There is no instability or tenderness.  Left knee shows no effusion. Range of motion is 10 to 120 degrees. Marked crepitus on range of motion. Tenderness medial greater than lateral. There is no instability noted. Pulses, sensation and motor are intact.  RADIOGRAPHS: Radiographs are reviewed from last visit AP and lateral of the knee showing severe endstage tricompartmental arthritis of the left knee bone-on-bone all three compartments worse lateral and patellofemoral. There is no significant angular deformity but all three compartments are involved.  Assessment & Plan Osteoarthritis Left Knee  Note: Patient is for a Left Total Knee Replacement for Dr. Lequita Halt.  Plan is to go home after the hospital stay.  Signed electronically by Roberts Gaudy, PA-C

## 2011-07-15 LAB — BASIC METABOLIC PANEL
CO2: 26 mEq/L (ref 19–32)
Calcium: 8.2 mg/dL — ABNORMAL LOW (ref 8.4–10.5)
Creatinine, Ser: 0.67 mg/dL (ref 0.50–1.35)
GFR calc non Af Amer: >90 mL/min (ref >90)
Glucose, Bld: 157 mg/dL — ABNORMAL HIGH (ref 70–99)

## 2011-07-15 LAB — CBC
Hemoglobin: 12.5 g/dL — ABNORMAL LOW (ref 13.0–17.0)
MCH: 30.6 pg (ref 26.0–34.0)
MCHC: 34.9 g/dL (ref 30.0–36.0)
MCV: 87.5 fL (ref 78.0–100.0)
RBC: 4.09 MIL/uL — ABNORMAL LOW (ref 4.22–5.81)

## 2011-07-15 MED ORDER — HYDROMORPHONE HCL PF 1 MG/ML IJ SOLN
0.5000 mg | INTRAMUSCULAR | Status: DC | PRN
  Administered 2011-07-15 – 2011-07-16 (×3): 1 mg via INTRAVENOUS
  Filled 2011-07-15 (×3): qty 1

## 2011-07-15 NOTE — Evaluation (Signed)
Physical Therapy Evaluation Patient Details Name: Manuel Galloway MRN: [id number] DOB: 09-Aug-1965 Today's Date: 07/15/2011 Time: 1478-2956 PT Time Calculation (min): 30 min  PT Assessment / Plan / Recommendation Clinical Impression  Pt with L TKR presents with decreased L LE strength/ROM with limitations in functional mobility    PT Assessment  Patient needs continued PT services    Follow Up Recommendations  Home health PT    Barriers to Discharge        Equipment Recommendations  3 in 1 bedside comode;Other (comment) (elongated if possible)    Recommendations for Other Services OT consult   Frequency 7X/week    Precautions / Restrictions Precautions Precautions: Knee Required Braces or Orthoses: Knee Immobilizer - Left Knee Immobilizer - Left: Discontinue once straight leg raise with < 10 degree lag Restrictions Weight Bearing Restrictions: No Other Position/Activity Restrictions: WBAT   Pertinent Vitals/Pain 6-7/10; cold packs provided, RN aware      Mobility  Bed Mobility Bed Mobility: Supine to Sit Supine to Sit: 4: Min assist Details for Bed Mobility Assistance: min assist with L LE and cues for sequence Transfers Transfers: Sit to Stand;Stand to Sit Sit to Stand: 4: Min assist;With upper extremity assist;From chair/3-in-1 Stand to Sit: 4: Min assist;With upper extremity assist;To chair/3-in-1 Details for Transfer Assistance: min verbal cues for hand placement and L LE out in front prior to stand/sit Ambulation/Gait Ambulation/Gait Assistance: 4: Min assist Ambulation Distance (Feet): 42 Feet Assistive device: Rolling walker Ambulation/Gait Assistance Details: cues for sequence, posture, and position from RW Gait Pattern: Step-to pattern    Exercises Total Joint Exercises Ankle Circles/Pumps: AROM;Both;10 reps;Supine Quad Sets: AROM;10 reps;Both;Supine Heel Slides: AAROM;10 reps;Supine;Left Straight Leg Raises: Left;AAROM;10 reps;Supine   PT  Diagnosis: Difficulty walking  PT Problem List: Decreased strength;Decreased range of motion;Decreased activity tolerance;Decreased mobility;Decreased knowledge of use of DME;Pain PT Treatment Interventions: DME instruction;Gait training;Stair training;Functional mobility training;Therapeutic activities;Therapeutic exercise;Patient/family education   PT Goals Acute Rehab PT Goals PT Goal Formulation: With patient Time For Goal Achievement: 07/20/11 Potential to Achieve Goals: Good Pt will go Supine/Side to Sit: with supervision PT Goal: Supine/Side to Sit - Progress: Goal set today Pt will go Sit to Supine/Side: with supervision PT Goal: Sit to Supine/Side - Progress: Goal set today Pt will go Sit to Stand: with supervision PT Goal: Sit to Stand - Progress: Goal set today Pt will go Stand to Sit: with supervision PT Goal: Stand to Sit - Progress: Goal set today Pt will Ambulate: 51 - 150 feet;with supervision;with rolling walker PT Goal: Ambulate - Progress: Goal set today Pt will Go Up / Down Stairs: 3-5 stairs;with min assist;with least restrictive assistive device PT Goal: Up/Down Stairs - Progress: Goal set today  Visit Information  Last PT Received On: 07/15/11 Assistance Needed: +1    Subjective Data  Subjective: My other knee causes me problems as well Patient Stated Goal: Work in my garden for more than 10 minutes before I am in pain   Prior Functioning  Home Living Lives With: Spouse;Family Available Help at Discharge: Family Type of Home: House Home Access: Level entry Home Layout: Able to live on main level with bedroom/bathroom Alternate Level Stairs-Number of Steps: 14 Alternate Level Stairs-Rails: Right Bathroom Shower/Tub: Health visitor: Standard (has higher commode upstairs but staying on main) Home Adaptive Equipment: Walker - rolling Prior Function Level of Independence: Independent Able to Take Stairs?: Yes Driving: Yes Vocation: Full  time employment Communication Communication: No difficulties Dominant Hand: Right  Cognition  Overall Cognitive Status: Appears within functional limits for tasks assessed/performed Arousal/Alertness: Awake/alert Orientation Level: Appears intact for tasks assessed Behavior During Session: [facility] for tasks performed    Extremity/Trunk Assessment Right Upper Extremity Assessment RUE ROM/Strength/Tone: [facility] for tasks assessed Left Upper Extremity Assessment LUE ROM/Strength/Tone: WFL for tasks assessed Right Lower Extremity Assessment RLE ROM/Strength/Tone: [facility] for tasks assessed Left Lower Extremity Assessment LLE ROM/Strength/Tone: Deficits LLE ROM/Strength/Tone Deficits: 3-/5 quads strength; AAROM at knee -10 - 80   Balance Balance Balance Assessed: Yes Dynamic Standing Balance Dynamic Standing - Level of Assistance: 4: Min assist  End of Session PT - End of Session Equipment Utilized During Treatment: Left knee immobilizer Activity Tolerance: Patient tolerated treatment well Patient left: in chair;with call bell/phone within reach Nurse Communication: Mobility status  GP     Darrold Bezek 07/15/2011, 1:05 PM

## 2011-07-15 NOTE — Evaluation (Signed)
Occupational Therapy Evaluation Patient Details Name: Manuel Galloway MRN: [id number] DOB: 11/09/1965 Today's Date: 07/15/2011 Time: 7829-5621 OT Time Calculation (min): 27 min  OT Assessment / Plan / Recommendation Clinical Impression  Pt is s/p L TKA and displays decreased strength and ADL/functional mobility independence. Will benefit from skilled OT services to increase independence with these tasks for discharge home with family.    OT Assessment  Patient needs continued OT Services    Follow Up Recommendations  No OT follow up    Barriers to Discharge      Equipment Recommendations  3 in 1 bedside comode;Other (comment) (elongated if possible)    Recommendations for Other Services    Frequency  Min 2X/week    Precautions / Restrictions Precautions Precautions: Knee Required Braces or Orthoses: Knee Immobilizer - Left Knee Immobilizer - Left: Discontinue once straight leg raise with < 10 degree lag Restrictions Weight Bearing Restrictions: No        ADL  Eating/Feeding: Simulated;Independent Where Assessed - Eating/Feeding: Chair Grooming: Simulated;Set up Where Assessed - Grooming: Supported sitting Upper Body Bathing: Simulated;Chest;Right arm;Left arm;Abdomen;Set up Where Assessed - Upper Body Bathing: Unsupported sitting Lower Body Bathing: Simulated;Minimal assistance Where Assessed - Lower Body Bathing: Supported sit to stand Upper Body Dressing: Simulated;Set up Where Assessed - Upper Body Dressing: Unsupported sitting Lower Body Dressing: Simulated;Minimal assistance Where Assessed - Lower Body Dressing: Supported sit to stand Toilet Transfer: Performed;Minimal assistance Toilet Transfer Method: Other (comment) (ambulating) Acupuncturist: Bedside commode Toileting - Clothing Manipulation and Hygiene: Simulated;Minimal assistance Where Assessed - Engineer, mining and Hygiene: Standing Tub/Shower Transfer Method: Not  assessed Equipment Used: Rolling walker ADL Comments: Pt already able to reach down and touch toes on both feet. Pt doing very well. Practiced 3in1 transfer. He would like an elongated 3in1 if possible.     OT Diagnosis: Generalized weakness  OT Problem List: Decreased strength;Decreased knowledge of use of DME or AE;Pain OT Treatment Interventions: Self-care/ADL training;Therapeutic activities;DME and/or AE instruction;Patient/family education   OT Goals Acute Rehab OT Goals OT Goal Formulation: With patient Time For Goal Achievement: 07/22/11 Potential to Achieve Goals: Good ADL Goals Pt Will Perform Grooming: with supervision;Standing at sink ADL Goal: Grooming - Progress: Goal set today Pt Will Transfer to Toilet: with supervision;with DME;Ambulation;3-in-1 ADL Goal: Toilet Transfer - Progress: Goal set today Pt Will Perform Toileting - Clothing Manipulation: with supervision;Standing ADL Goal: Toileting - Clothing Manipulation - Progress: Goal set today Pt Will Perform Tub/Shower Transfer: with supervision;with DME ADL Goal: Tub/Shower Transfer - Progress: Goal set today  Visit Information  Last OT Received On: 07/15/11 Assistance Needed: +1    Subjective Data  Subjective: how long till I can shower Patient Stated Goal: to go home when ready   Prior Functioning  Home Living Lives With: Spouse;Family Available Help at Discharge: Family Home Layout: Able to live on main level with bedroom/bathroom Bathroom Shower/Tub: Health visitor: Standard (has higher commode upstairs but staying on main) Home Adaptive Equipment: Walker - rolling Prior Function Level of Independence: Independent Communication Communication: No difficulties    Cognition  Overall Cognitive Status: Appears within functional limits for tasks assessed/performed Arousal/Alertness: Awake/alert Orientation Level: Appears intact for tasks assessed Behavior During Session: [facility] for tasks  performed    Extremity/Trunk Assessment Right Upper Extremity Assessment RUE ROM/Strength/Tone: [facility] for tasks assessed Left Upper Extremity Assessment LUE ROM/Strength/Tone: WFL for tasks assessed   Mobility Transfers Transfers: Sit to Stand;Stand to Sit Sit to Stand: 4: Min  assist;With upper extremity assist;From chair/3-in-1 Stand to Sit: 4: Min assist;With upper extremity assist;To chair/3-in-1 Details for Transfer Assistance: min verbal cues for hand placement and L LE out in front prior to stand/sit   Exercise    Balance Balance Balance Assessed: Yes Dynamic Standing Balance Dynamic Standing - Level of Assistance: 4: Min assist  End of Session OT - End of Session Activity Tolerance: Patient tolerated treatment well Patient left: in chair;with call bell/phone within reach  GO     Lennox Laity [phone] 07/15/2011, 1:02 PM

## 2011-07-15 NOTE — Progress Notes (Signed)
Physical Therapy Treatment Patient Details Name: Manuel Galloway MRN: [id number] DOB: August 21, 1965 Today's Date: 07/15/2011 Time: 7846-9629 PT Time Calculation (min): 22 min  PT Assessment / Plan / Recommendation Comments on Treatment Session       Follow Up Recommendations  Home health PT    Barriers to Discharge        Equipment Recommendations  3 in 1 bedside comode;Other (comment)    Recommendations for Other Services OT consult  Frequency 7X/week   Plan Discharge plan remains appropriate    Precautions / Restrictions Precautions Precautions: Knee Required Braces or Orthoses: Knee Immobilizer - Left Knee Immobilizer - Left: Discontinue once straight leg raise with < 10 degree lag Restrictions Weight Bearing Restrictions: No Other Position/Activity Restrictions: WBAT   Pertinent Vitals/Pain 2/10 initially but progressed to 7/10 with activity; RN aware and providing pain meds    Mobility  Bed Mobility Bed Mobility: Sit to Supine Supine to Sit: 4: Min assist Sit to Supine: 4: Min assist Details for Bed Mobility Assistance: min assist with L LE and cues for sequence Transfers Transfers: Sit to Stand;Stand to Sit Sit to Stand: 4: Min guard Stand to Sit: 4: Min guard Details for Transfer Assistance: min verbal cues for hand placement and L LE out in front prior to stand/sit Ambulation/Gait Ambulation/Gait Assistance: 4: Min guard Ambulation Distance (Feet): 235 Feet Assistive device: Rolling walker Ambulation/Gait Assistance Details: cues for posture and position from RW Gait Pattern: Step-to pattern    Exercises Total Joint Exercises Ankle Circles/Pumps: AROM;Both;10 reps;Supine Quad Sets: AROM;10 reps;Both;Supine Heel Slides: AAROM;10 reps;Supine;Left Straight Leg Raises: Left;AAROM;10 reps;Supine   PT Diagnosis: Difficulty walking  PT Problem List: Decreased strength;Decreased range of motion;Decreased activity tolerance;Decreased mobility;Decreased knowledge  of use of DME;Pain PT Treatment Interventions: DME instruction;Gait training;Stair training;Functional mobility training;Therapeutic activities;Therapeutic exercise;Patient/family education   PT Goals Acute Rehab PT Goals PT Goal Formulation: With patient Time For Goal Achievement: 07/20/11 Potential to Achieve Goals: Good Pt will go Supine/Side to Sit: with supervision PT Goal: Supine/Side to Sit - Progress: Progressing toward goal Pt will go Sit to Supine/Side: with supervision PT Goal: Sit to Supine/Side - Progress: Progressing toward goal Pt will go Sit to Stand: with supervision PT Goal: Sit to Stand - Progress: Progressing toward goal Pt will go Stand to Sit: with supervision PT Goal: Stand to Sit - Progress: Progressing toward goal Pt will Ambulate: 51 - 150 feet;with supervision;with rolling walker PT Goal: Ambulate - Progress: Progressing toward goal Pt will Go Up / Down Stairs: 3-5 stairs;with min assist;with least restrictive assistive device PT Goal: Up/Down Stairs - Progress: Goal set today  Visit Information  Last PT Received On: 07/15/11 Assistance Needed: +1    Subjective Data  Subjective: I'm feeling better than this morning Patient Stated Goal: Work in my garden for more than 10 minutes before I am in pain   Cognition  Overall Cognitive Status: Appears within functional limits for tasks assessed/performed Arousal/Alertness: Awake/alert Orientation Level: Appears intact for tasks assessed Behavior During Session: [facility] for tasks performed    Balance  Balance Balance Assessed: Yes Dynamic Standing Balance Dynamic Standing - Level of Assistance: 4: Min assist  End of Session PT - End of Session Equipment Utilized During Treatment: Left knee immobilizer Activity Tolerance: Patient tolerated treatment well Patient left: with call bell/phone within reach;in bed Nurse Communication: Mobility status   GP     Amaan Meyer 07/15/2011, 3:02 PM

## 2011-07-15 NOTE — Progress Notes (Signed)
Subjective: 1 Day Post-Op Procedure(s) (LRB): TOTAL KNEE ARTHROPLASTY (Left) Patient reports pain as mild.   Patient seen in rounds by Dr. Lequita Halt. Patient is well, and has had no acute complaints or problems We will start therapy today.  Plan is to go Home after hospital stay.  Objective: Vital signs in last 24 hours: Temp:  [96.9 F (36.1 C)-98.7 F (37.1 C)] 97.6 F (36.4 C) (06/27 4098) Pulse Rate:  [71-97] 81  (06/27 0652) Resp:  [12-20] 14  (06/27 0652) BP: (102-140)/(61-84) 114/72 mmHg (06/27 0652) SpO2:  [95 %-100 %] 99 % (06/27 0652) Weight:  [117.935 kg (260 lb)] 117.935 kg (260 lb) (06/26 1700)  Intake/Output from previous day:  Intake/Output Summary (Last 24 hours) at 07/15/11 0844 Last data filed at 07/15/11 0600  Gross per 24 hour  Intake   4155 ml  Output   2830 ml  Net   1325 ml    Intake/Output this shift: UOP 1850  Labs:  [facility] 07/15/11 0437  HGB 12.5*    Basename 07/15/11 0437  WBC 15.1*  RBC 4.09*  HCT 35.8*  PLT 182    Basename 07/15/11 0437  NA 134*  K 3.8  CL 101  CO2 26  BUN 12  CREATININE 0.67  GLUCOSE 157*  CALCIUM 8.2*   No results found for this basename: LABPT:2,INR:2 in the last 72 hours  EXAM General - Patient is Alert, Appropriate and Oriented Extremity - Neurovascular intact Sensation intact distally Dressing - dressing C/D/I Motor Function - intact, moving foot and toes well on exam.  Hemovac pulled without difficulty.  Past Medical History  Diagnosis Date  . Arthritis     Assessment/Plan: 1 Day Post-Op Procedure(s) (LRB): TOTAL KNEE ARTHROPLASTY (Left) Principal Problem:  *OA (osteoarthritis) of knee   Advance diet Up with therapy Discharge home with home health  DVT Prophylaxis - Xarelto Weight-Bearing as tolerated to left leg No vaccines. D/C PCA, Change to IV push D/C O2 and Pulse OX and try on Room [address]  Manuel Galloway 07/15/2011, 8:44 AM

## 2011-07-16 ENCOUNTER — Encounter (HOSPITAL_COMMUNITY): Payer: Self-pay | Admitting: Orthopedic Surgery

## 2011-07-16 DIAGNOSIS — E871 Hypo-osmolality and hyponatremia: Secondary | ICD-10-CM

## 2011-07-16 DIAGNOSIS — E876 Hypokalemia: Secondary | ICD-10-CM

## 2011-07-16 LAB — CBC
MCH: 30.4 pg (ref 26.0–34.0)
MCHC: 34.8 g/dL (ref 30.0–36.0)
MCV: 87.3 fL (ref 78.0–100.0)
Platelets: 136 10*3/uL — ABNORMAL LOW (ref 150–400)
RDW: 12.8 % (ref 11.5–15.5)

## 2011-07-16 LAB — BASIC METABOLIC PANEL
BUN: 9 mg/dL (ref 6–23)
CO2: 25 mEq/L (ref 19–32)
Glucose, Bld: 125 mg/dL — ABNORMAL HIGH (ref 70–99)
Sodium: 134 mEq/L — ABNORMAL LOW (ref 135–145)

## 2011-07-16 MED ORDER — POTASSIUM CHLORIDE CRYS ER 20 MEQ PO TBCR
40.0000 meq | EXTENDED_RELEASE_TABLET | ORAL | Status: AC
  Administered 2011-07-16 (×2): 40 meq via ORAL
  Filled 2011-07-16 (×2): qty 2

## 2011-07-16 MED ORDER — METHOCARBAMOL 500 MG PO TABS: 500.0000 mg | ORAL_TABLET | Freq: Four times a day (QID) | ORAL | Status: AC | PRN

## 2011-07-16 MED ORDER — RIVAROXABAN 10 MG PO TABS: 10.0000 mg | ORAL_TABLET | Freq: Every day | ORAL | Status: DC

## 2011-07-16 MED ORDER — OXYCODONE HCL 5 MG PO TABS: 5.0000 mg | ORAL_TABLET | ORAL | Status: AC | PRN

## 2011-07-16 NOTE — Care Management Note (Signed)
    Page 1 of 2   07/16/2011     11:42:38 AM   CARE MANAGEMENT NOTE 07/16/2011  Patient:  Manuel Galloway, Manuel Galloway   Account Number:  [id number]  Date Initiated:  07/15/2011  Documentation initiated by:  Colleen Can  Subjective/Objective Assessment:   DX OSTEOARTHRITIS KNEE-LEFT; OTOAL KNEE REPLACEMNT     Action/Plan:   CM spoke with patient and spouse. Plans are for patient to return to his home where spouse will be caregiver. He will use [place] for [facility] services and they will arrage for DME needs-rw   Anticipated DC Date:  07/17/2011   Anticipated DC Plan:  HOME W HOME HEALTH SERVICES  In-house referral  NA      DC Planning Services  CM consult      [facility] Choice  HOME HEALTH  DURABLE MEDICAL EQUIPMENT   Choice offered to / List presented to:  C-1 Patient   DME arranged  NA      DME agency  NA     HH arranged  HH-2 PT      [facility] agency  [facility]   Status of service:  Completed, signed off Medicare Important Message given?   (If response is "NO", the following Medicare IM given date fields will be blank) Date Medicare IM given:   Date Additional Medicare IM given:    Discharge Disposition:    Per UR Regulation:  Reviewed for med. necessity/level of care/duration of stay  If discussed at Long Length of Stay Meetings, dates discussed:    Comments:  07/16/2011 Raynelle Bring BSN CCM [phone] Genevieve Norlander will provide HHpt with start date of day after discharge.

## 2011-07-16 NOTE — Progress Notes (Signed)
Physical Therapy Treatment Patient Details Name: Manuel Galloway MRN: [id number] DOB: 20-Jun-1965 Today's Date: 07/16/2011 Time: 4098-1191 PT Time Calculation (min): 21 min  PT Assessment / Plan / Recommendation Comments on Treatment Session  OOB deferred pending next Pain Meds    Follow Up Recommendations  Home health PT    Barriers to Discharge        Equipment Recommendations  3 in 1 bedside comode;Other (comment);Rolling walker with 5" wheels    Recommendations for Other Services OT consult  Frequency 7X/week   Plan Discharge plan remains appropriate    Precautions / Restrictions Precautions Precautions: Knee Required Braces or Orthoses: Knee Immobilizer - Left Knee Immobilizer - Left: Discontinue once straight leg raise with < 10 degree lag Restrictions Weight Bearing Restrictions: No Other Position/Activity Restrictions: WBAT   Pertinent Vitals/Pain 8/10; premedicated, cold packs applied    Mobility       Exercises Total Joint Exercises Ankle Circles/Pumps: AROM;Both;10 reps;Supine Quad Sets: AROM;10 reps;Both;Supine Heel Slides: AAROM;10 reps;Supine;Left Straight Leg Raises: Left;AAROM;10 reps;Supine   PT Diagnosis:    PT Problem List:   PT Treatment Interventions:     PT Goals Acute Rehab PT Goals PT Goal Formulation: With patient Time For Goal Achievement: 07/20/11 Potential to Achieve Goals: Good  Visit Information  Last PT Received On: 07/16/11 Assistance Needed: +1    Subjective Data  Subjective: I had a rough night and it still hurts but I'm doing better and want to try to do something. Patient Stated Goal: Work in my garden for more than 10 minutes before I am in pain   Cognition  Overall Cognitive Status: Appears within functional limits for tasks assessed/performed Arousal/Alertness: Awake/alert Orientation Level: Appears intact for tasks assessed Behavior During Session: [facility] for tasks performed    Balance     End of Session PT - End  of Session Activity Tolerance: Patient limited by pain Patient left: in bed;with call bell/phone within reach Nurse Communication: Other (comment) (prearranged pain meds for next session)   GP     Tobby Fawcett 07/16/2011, 12:52 PM

## 2011-07-16 NOTE — Plan of Care (Signed)
Problem: Consults Goal: Diagnosis- Total Joint Replacement Outcome: Completed/Met Date Met:  07/16/11 Total Left knee

## 2011-07-16 NOTE — Progress Notes (Signed)
Subjective: 2 Days Post-Op Procedure(s) (LRB): TOTAL KNEE ARTHROPLASTY (Left) Patient reports pain as mild and moderate.   Patient seen in rounds with Dr. Lequita Halt. Patient is well, but has had some minor complaints of pain in the knee, requiring pain medications Plan is to go Home after hospital stay.  Objective: Vital signs in last 24 hours: Temp:  [98.1 F (36.7 C)-98.9 F (37.2 C)] 98.9 F (37.2 C) (06/28 0555) Pulse Rate:  [79-91] 79  (06/28 0555) Resp:  [14-16] 16  (06/28 0555) BP: (112-126)/(62-75) 112/62 mmHg (06/28 0555) SpO2:  [94 %-96 %] 94 % (06/28 0555)  Intake/Output from previous day:  Intake/Output Summary (Last 24 hours) at 07/16/11 0720 Last data filed at 07/16/11 0159  Gross per 24 hour  Intake   1480 ml  Output   1150 ml  Net    330 ml    Intake/Output this shift:    Labs:  Basename 07/16/11 0430 07/15/11 0437  HGB 10.4* 12.5*    Basename 07/16/11 0430 07/15/11 0437  WBC 11.4* 15.1*  RBC 3.39* 4.09*  HCT 29.6* 35.8*  PLT 136* 182    Basename 07/16/11 0430 07/15/11 0437  NA 134* 134*  K 3.2* 3.8  CL 102 101  CO2 25 26  BUN 9 12  CREATININE 0.73 0.67  GLUCOSE 125* 157*  CALCIUM 8.0* 8.2*   No results found for this basename: LABPT:2,INR:2 in the last 72 hours  EXAM General - Patient is Alert, Appropriate and Oriented Extremity - Neurovascular intact Sensation intact distally Dorsiflexion/Plantar flexion intact Dressing/Incision - clean, dry, no drainage, healing Motor Function - intact, moving foot and toes well on exam.   Past Medical History  Diagnosis Date  . Arthritis     Assessment/Plan: 2 Days Post-Op Procedure(s) (LRB): TOTAL KNEE ARTHROPLASTY (Left) Principal Problem:  *OA (osteoarthritis) of knee   Up with therapy Plan for discharge tomorrow Discharge home with home health  DVT Prophylaxis - Xarelto Weight-Bearing as tolerated to left leg Possible later today if does better.  Elynn Patteson 07/16/2011,  7:20 AM

## 2011-07-16 NOTE — Discharge Instructions (Signed)
 Dr. Frank Aluisio Total Joint Specialist [facility] [address] [place], [place] [address] [phone]  TOTAL KNEE REPLACEMENT POSTOPERATIVE DIRECTIONS    Knee Rehabilitation, Guidelines Following Surgery  Results after knee surgery are often greatly improved when you follow the exercise, range of motion and muscle strengthening exercises prescribed by your doctor. Safety measures are also important to protect the knee from further injury. Any time any of these exercises cause you to have increased pain or swelling in your knee joint, decrease the amount until you are comfortable again and slowly increase them. If you have problems or questions, call your caregiver or physical therapist for advice.   HOME CARE INSTRUCTIONS  Remove items at home which could result in a fall. This includes throw rugs or furniture in walking pathways.  Continue medications as instructed at time of discharge. You may have some home medications which will be placed on hold until you complete the course of blood thinner medication.  You may start showering once you are discharged home but do not submerge the incision under water. Just pat the incision dry and apply a dry gauze dressing on daily. Walk with walker as instructed.   Use walker as long as suggested by your caregivers.  Avoid periods of inactivity such as sitting longer than an hour when not asleep. This helps prevent blood clots.  You may put full weight on your legs and walk as much as is comfortable.  You may resume a sexual relationship in one month or when given the OK by your doctor.  You may return to work once you are cleared by your doctor.  Do not drive a car for 6 weeks or until released by you surgeon.   Do not drive while taking narcotics.  Wear the elastic stockings for three weeks following surgery during the day but you may remove then at night. Make sure you keep all of your appointments after your  operation with all of your doctors and caregivers. You should call the office at the above phone number and make an appointment for approximately two weeks after the date of your surgery. Change the dressing daily and reapply a dry dressing each time. Please pick up a stool softener and laxative for home use as long as you are requiring pain medications.  Continue to use ice on the knee for pain and swelling from surgery. You may notice swelling that will progress down to the foot and ankle.  This is normal after surgery.  Elevate the leg when you are not up walking on it.   It is important for you to complete the blood thinner medication as prescribed by your doctor.  Continue to use the breathing machine which will help keep your temperature down.  It is common for your temperature to cycle up and down following surgery, especially at night when you are not up moving around and exerting yourself.  The breathing machine keeps your lungs expanded and your temperature down.  RANGE OF MOTION AND STRENGTHENING EXERCISES  Rehabilitation of the knee is important following a knee injury or an operation. After just a few days of immobilization, the muscles of the thigh which control the knee become weakened and shrink (atrophy). Knee exercises are designed to build up the tone and strength of the thigh muscles and to improve knee motion. Often times heat used for twenty to thirty minutes before working out will loosen up your tissues and help with improving the range   of motion but do not use heat for the first two weeks following surgery. These exercises can be done on a training (exercise) mat, on the floor, on a table or on a bed. Use what ever works the best and is most comfortable for you Knee exercises include:  Leg Lifts - While your knee is still immobilized in a splint or cast, you can do straight leg raises. Lift the leg to 60 degrees, hold for 3 sec, and slowly lower the leg. Repeat 10-20 times 2-3  times daily. Perform this exercise against resistance later as your knee gets better.  Quad and Hamstring Sets - Tighten up the muscle on the front of the thigh (Quad) and hold for 5-10 sec. Repeat this 10-20 times hourly. Hamstring sets are done by pushing the foot backward against an object and holding for 5-10 sec. Repeat as with quad sets.  A rehabilitation program following serious knee injuries can speed recovery and prevent re-injury in the future due to weakened muscles. Contact your doctor or a physical therapist for more information on knee rehabilitation.   SKILLED REHAB INSTRUCTIONS: If the patient is transferred to a skilled rehab facility following release from the hospital, a list of the current medications will be sent to the facility for the patient to continue.  When discharged from the skilled rehab facility, please have the facility set up the patient's Home Health Physical Therapy prior to being released. Also, the skilled facility will be responsible for providing the patient with their medications at time of release from the facility to include their pain medication, the muscle relaxants, and their blood thinner medication. If the patient is still at the rehab facility at time of the two week follow up appointment, the skilled rehab facility will also need to assist the patient in arranging follow up appointment in our office and any transportation needs.  MAKE SURE YOU:  Understand these instructions.  Will watch your condition.  Will get help right away if you are not doing well or get worse.  Document Released: 01/04/2005 Document Revised: 12/24/2010 Document Reviewed: 06/24/2006  ExitCare Patient Information 2012 [employer]   Take Xarelto for two and a half more weeks, then discontinue Xarelto.        

## 2011-07-16 NOTE — Plan of Care (Signed)
Problem: Discharge Progression Outcomes Goal: Negotiates stairs Outcome: Adequate for Discharge Patient did not complete stairs but feels comfortable going home despite this fact.  Will work with HHPT tomorrow on steps. Avel Peace, PA, notified and agrees.

## 2011-07-16 NOTE — Progress Notes (Signed)
Physical Therapy Treatment Patient Details Name: Manuel Galloway MRN: [id number] DOB: [birth year] Today's Date: 07/16/2011 Time: 4098-1191 PT Time Calculation (min): 19 min  PT Assessment / Plan / Recommendation Comments on Treatment Session  Pt continues pain ltd but tolerating limited activity with min difficulty    Follow Up Recommendations  Home health PT    Barriers to Discharge        Equipment Recommendations  3 in 1 bedside comode;Other (comment);Rolling walker with 5" wheels    Recommendations for Other Services OT consult  Frequency 7X/week   Plan Discharge plan remains appropriate    Precautions / Restrictions Precautions Precautions: Knee Required Braces or Orthoses: Knee Immobilizer - Left Knee Immobilizer - Left: Discontinue once straight leg raise with < 10 degree lag Restrictions Weight Bearing Restrictions: No Other Position/Activity Restrictions: WBAT   Pertinent Vitals/Pain Pt reports pain at rest ~5/10 but increases with activity to 7-8/10 with burning sensation.  Pt premedicated by RN and provided with ice packs.  RN aware    Mobility  Bed Mobility Bed Mobility: Supine to Sit Supine to Sit: 4: Min assist Details for Bed Mobility Assistance: min assist for L LE Transfers Transfers: Sit to Stand;Stand to Sit Sit to Stand: 4: Min guard Stand to Sit: 4: Min guard Details for Transfer Assistance: min verbal cues for hand placement and L LE out in front prior to stand/sit Ambulation/Gait Ambulation/Gait Assistance: 4: Min guard Ambulation Distance (Feet): 45 Feet Assistive device: Rolling walker Ambulation/Gait Assistance Details: min cues for position from RW and sequence; distance ltd by pain Gait Pattern: Step-to pattern    Exercises     PT Diagnosis:    PT Problem List:   PT Treatment Interventions:     PT Goals Acute Rehab PT Goals PT Goal Formulation: With patient Time For Goal Achievement: 07/20/11 Potential to Achieve Goals: Good Pt  will go Supine/Side to Sit: with supervision PT Goal: Supine/Side to Sit - Progress: Progressing toward goal Pt will go Sit to Supine/Side: with supervision PT Goal: Sit to Supine/Side - Progress: Progressing toward goal Pt will go Sit to Stand: with supervision PT Goal: Sit to Stand - Progress: Progressing toward goal Pt will go Stand to Sit: with supervision PT Goal: Stand to Sit - Progress: Progressing toward goal Pt will Ambulate: 51 - 150 feet;with supervision;with rolling walker PT Goal: Ambulate - Progress: Progressing toward goal  Visit Information  Last PT Received On: 07/16/11 Assistance Needed: +1    Subjective Data  Subjective: Well its definitely not as easy as it was yesterday Patient Stated Goal: Work in my garden for more than 10 minutes before I am in pain   Cognition  Overall Cognitive Status: Appears within functional limits for tasks assessed/performed Arousal/Alertness: Awake/alert Orientation Level: Appears intact for tasks assessed Behavior During Session: Manuel Galloway for tasks performed    Balance     End of Session PT - End of Session Equipment Utilized During Treatment: Left knee immobilizer Activity Tolerance: Patient limited by pain Patient left: in chair;with call bell/phone within reach Nurse Communication: Mobility status   GP     Manuel Galloway 07/16/2011, 4:21 PM

## 2011-07-16 NOTE — Progress Notes (Signed)
RN reviewed all discharge instructions, as well as prescriptions with Patient and Wife. Both verbalized understanding of discharge instructions. IV d/c'd, pt changed into street clothes, and in no visible pain or distress. D/C to home with wife via wheelchair. Nehemiah Mcfarren [place], [place] 07/16/2011 5:47 PM

## 2011-07-16 NOTE — Progress Notes (Addendum)
Subjective: 2 Days Post-Op Procedure(s) (LRB): TOTAL KNEE ARTHROPLASTY (Left) Patient reports pain as moderate.   Patient seen in rounds with Dr. Lequita Halt. He did very well with therapy yesterday but hurt a lot more last night and this morning.  Will see how he does today.  If improves and is feeling better, then possibly home later this afternoon. Patient is having problems with pain in the knee, requiring pain medications   Objective: Vital signs in last 24 hours: Temp:  [98.1 F (36.7 C)-98.9 F (37.2 C)] 98.9 F (37.2 C) (06/28 0555) Pulse Rate:  [79-91] 79  (06/28 0555) Resp:  [14-16] 16  (06/28 0555) BP: (112-126)/(62-75) 112/62 mmHg (06/28 0555) SpO2:  [94 %-96 %] 94 % (06/28 0555)  Intake/Output from previous day:  Intake/Output Summary (Last 24 hours) at 07/16/11 0857 Last data filed at 07/16/11 1610  Gross per 24 hour  Intake   1200 ml  Output   1150 ml  Net     50 ml    Intake/Output this shift: Total I/O In: 360 [P.O.:360] Out: -   Labs:  Basename 07/16/11 0430 07/15/11 0437  HGB 10.4* 12.5*    Basename 07/16/11 0430 07/15/11 0437  WBC 11.4* 15.1*  RBC 3.39* 4.09*  HCT 29.6* 35.8*  PLT 136* 182    Basename 07/16/11 0430 07/15/11 0437  NA 134* 134*  K 3.2* 3.8  CL 102 101  CO2 25 26  BUN 9 12  CREATININE 0.73 0.67  GLUCOSE 125* 157*  CALCIUM 8.0* 8.2*   No results found for this basename: LABPT:2,INR:2 in the last 72 hours  EXAM: General - Patient is Alert, Appropriate and Oriented Extremity - Neurovascular intact Sensation intact distally Dorsiflexion/Plantar flexion intact No cellulitis present Incision - clean, dry, no drainage, healing Motor Function - intact, moving foot and toes well on exam.   Assessment/Plan: 2 Days Post-Op Procedure(s) (LRB): TOTAL KNEE ARTHROPLASTY (Left) Procedure(s) (LRB): TOTAL KNEE ARTHROPLASTY (Left) Past Medical History  Diagnosis Date  . Arthritis    Principal Problem:  *OA (osteoarthritis) of  knee   Up with therapy Diet - Regular diet Follow up - in 2 weeks Activity - WBAT Disposition - Home D/C Meds - See DC Summary DVT Prophylaxis - Xarelto  Possibly home later this afternoon or possibly tomorrow depending upon progress.  Natacia Chaisson 07/16/2011, 8:57 AM  Addendum Note Came up to see patient this afternoon.  He is doing much better and pain is controlled.  He wants to go home.  Wife in room with him.  Went over some directions with him.  Will have him follow up back in the office in two weeks. Avel Peace, PA-C July 16, 2011 5:10 PM

## 2011-07-16 NOTE — Progress Notes (Signed)
Occupational Therapy Note Chart reviewed. Attempted to see pt for OT but he was having too much pain and had just received pain meds. Will try at a later time. Judithann Sauger OTR/L [phone] 07/16/2011

## 2011-07-26 NOTE — Discharge Summary (Signed)
Physician Discharge Summary   Patient ID: Manuel Galloway MRN: [id number] DOB/AGE: 1965/09/21 46 y.o.  Admit date: 07/14/2011 Discharge date: 07/16/2011  Primary Diagnosis: Osteoarthritis Left Knee   Admission Diagnoses:  Past Medical History  Diagnosis Date  . Arthritis    Discharge Diagnoses:   Principal Problem:  *OA (osteoarthritis) of knee Active Problems:  Postop Hyponatremia  Postop Hypokalemia  Procedure:  Procedure(s) (LRB): TOTAL KNEE ARTHROPLASTY (Left)   Consults: None  HPI: Manuel Galloway is a 46 y.o. year old male with end stage OA of his left knee with progressively worsening pain and dysfunction. He has constant pain, with activity and at rest and significant functional deficits with difficulties even with ADLs. He has had extensive non-op management including analgesics, injections of cortisone and viscosupplements, and home exercise program, but remains in significant pain with significant dysfunction. Radiographs show bone on bone arthritis lateral and patellofemoral with tibial subluxation. He presents now for left Total Knee Arthroplasty.   Laboratory Data: Hospital Outpatient Visit on 07/06/2011  Component Date Value Range Status  . MRSA, PCR 07/06/2011 NEGATIVE  NEGATIVE Final  . Staphylococcus aureus 07/06/2011 POSITIVE* NEGATIVE Final   Comment:                                 The Xpert SA Assay (FDA                          approved for NASAL specimens                          only), is one component of                          a comprehensive surveillance                          program.  It is not intended                          to diagnose infection nor to                          guide or monitor treatment.  Marland Kitchen aPTT 07/06/2011 28  24 - 37 seconds Final  . WBC 07/06/2011 6.2  4.0 - 10.5 K/uL Final  . RBC 07/06/2011 4.94  4.22 - 5.81 MIL/uL Final  . Hemoglobin 07/06/2011 15.1  13.0 - 17.0 g/dL Final  . HCT 40/98/1191 43.0  39.0 - 52.0 %  Final  . MCV 07/06/2011 87.0  78.0 - 100.0 fL Final  . MCH 07/06/2011 30.6  26.0 - 34.0 pg Final  . MCHC 07/06/2011 35.1  30.0 - 36.0 g/dL Final  . RDW 47/82/9562 12.5  11.5 - 15.5 % Final  . Platelets 07/06/2011 183  150 - 400 K/uL Final  . Sodium 07/06/2011 137  135 - 145 mEq/L Final  . Potassium 07/06/2011 4.3  3.5 - 5.1 mEq/L Final  . Chloride 07/06/2011 100  96 - 112 mEq/L Final  . CO2 07/06/2011 28  19 - 32 mEq/L Final  . Glucose, Bld 07/06/2011 99  70 - 99 mg/dL Final  . BUN 13/08/6576 13  6 - 23 mg/dL Final  . Creatinine, Ser 07/06/2011 0.85  0.50 - 1.35 mg/dL Final  . Calcium 16/10/9602 9.5  8.4 - 10.5 mg/dL Final  . Total Protein 07/06/2011 7.7  6.0 - 8.3 g/dL Final  . Albumin 54/09/8117 4.3  3.5 - 5.2 g/dL Final  . AST 14/78/2956 28  0 - 37 U/L Final  . ALT 07/06/2011 50  0 - 53 U/L Final  . Alkaline Phosphatase 07/06/2011 58  39 - 117 U/L Final  . Total Bilirubin 07/06/2011 0.5  0.3 - 1.2 mg/dL Final  . GFR calc non Af Amer 07/06/2011 >90  >90 mL/min Final  . GFR calc Af Amer 07/06/2011 >90  >90 mL/min Final   Comment:                                 The eGFR has been calculated                          using the CKD EPI equation.                          This calculation has not been                          validated in all clinical                          situations.                          eGFR's persistently                          <90 mL/min signify                          possible Chronic Kidney Disease.  Marland Kitchen Prothrombin Time 07/06/2011 13.2  11.6 - 15.2 seconds Final  . INR 07/06/2011 0.98  0.00 - 1.49 Final  . Color, Urine 07/06/2011 YELLOW  YELLOW Final  . APPearance 07/06/2011 CLEAR  CLEAR Final  . Specific Gravity, Urine 07/06/2011 1.012  1.005 - 1.030 Final  . pH 07/06/2011 6.5  5.0 - 8.0 Final  . Glucose, UA 07/06/2011 NEGATIVE  NEGATIVE mg/dL Final  . Hgb urine dipstick 07/06/2011 NEGATIVE  NEGATIVE Final  . Bilirubin Urine 07/06/2011 NEGATIVE   NEGATIVE Final  . Ketones, ur 07/06/2011 NEGATIVE  NEGATIVE mg/dL Final  . Protein, ur 21/30/8657 NEGATIVE  NEGATIVE mg/dL Final  . Urobilinogen, UA 07/06/2011 0.2  0.0 - 1.0 mg/dL Final  . Nitrite 84/69/6295 NEGATIVE  NEGATIVE Final  . Leukocytes, UA 07/06/2011 NEGATIVE  NEGATIVE Final   MICROSCOPIC NOT DONE ON URINES WITH NEGATIVE PROTEIN, BLOOD, LEUKOCYTES, NITRITE, OR GLUCOSE <1000 mg/dL.   No results found for this basename: HGB:5 in the last 72 hours No results found for this basename: WBC:2,RBC:2,HCT:2,PLT:2 in the last 72 hours No results found for this basename: NA:2,K:2,CL:2,CO2:2,BUN:2,CREATININE:2,GLUCOSE:2,CALCIUM:2 in the last 72 hours No results found for this basename: LABPT:2,INR:2 in the last 72 hours  X-Rays:No results found.  EKG:No orders found for this or any previous visit.   Hospital Course: Patient was admitted to [facility] and taken to the OR and underwent the above state procedure without complications.  Patient tolerated the procedure well  and was later transferred to the recovery room and then to the orthopaedic floor for postoperative care.  They were given PO and IV analgesics for pain control following their surgery.  They were given 24 hours of postoperative antibiotics and started on DVT prophylaxis in the form of Xarelto.   PT and OT were ordered for total joint protocol.  Discharge planning consulted to help with postop disposition and equipment needs.  Patient had a good night on the evening of surgery and started to get up OOB with therapy on day one.  PCA was discontinued and they were weaned over to PO meds.  Hemovac drain was pulled without difficulty.  Continued to work with therapy into day two.  Dressing was changed on day two and the incision was healing well.  Patient was seen in rounds and was ready to go home later that evening after therapy.  Discharge Medications: Prior to Admission medications   Medication Sig Start Date End Date  Taking? Authorizing Provider  PARoxetine (PAXIL) 20 MG tablet Take 20 mg by mouth every morning.   Yes Historical Provider, MD  methocarbamol (ROBAXIN) 500 MG tablet Take 1 tablet (500 mg total) by mouth every 6 (six) hours as needed. 07/16/11 07/26/11  Teiana Hajduk, PA  oxyCODONE (OXY IR/ROXICODONE) 5 MG immediate release tablet Take 1-2 tablets (5-10 mg total) by mouth every 4 (four) hours as needed for pain. 07/16/11 07/26/11  Galilee Pierron Julien Girt, PA  rivaroxaban (XARELTO) 10 MG TABS tablet Take 1 tablet (10 mg total) by mouth daily with breakfast. Take Xarelto for two and a half more weeks, then discontinue Xarelto. 07/16/11   Vanassa Penniman Julien Girt, PA    Diet: Regular diet Activity:WBAT Follow-up:in 2 weeks Disposition - Home Discharged Condition: good   Discharge Orders    Future Orders Please Complete By Expires   Diet general      Call MD / Call 911      Comments:   If you experience chest pain or shortness of breath, CALL 911 and be transported to the hospital emergency room.  If you develope a fever above 101 F, pus (white drainage) or increased drainage or redness at the wound, or calf pain, call your surgeon's office.   Discharge instructions      Comments:   Pick up stool softner and laxative for home. Do not submerge incision under water. May shower. Continue to use ice for pain and swelling from surgery.  Take Xarelto for two and a half more weeks, then discontinue Xarelto.   Constipation Prevention      Comments:   Drink plenty of fluids.  Prune juice may be helpful.  You may use a stool softener, such as Colace (over the counter) 100 mg twice a day.  Use MiraLax (over the counter) for constipation as needed.   Increase activity slowly as tolerated      Patient may shower      Comments:   You may shower without a dressing once there is no drainage.  Do not wash over the wound.  If drainage remains, do not shower until drainage stops.   Driving restrictions       Comments:   No driving until released by the physician.   Lifting restrictions      Comments:   No lifting until released by the physician.   TED hose      Comments:   Use stockings (TED hose) for 3 weeks on both leg(s).  You may remove them at  night for sleeping.   Change dressing      Comments:   Change dressing daily with sterile 4 x 4 inch gauze dressing and apply TED hose. Do not submerge the incision under water.   Do not put a pillow under the knee. Place it under the heel.      Do not sit on low chairs, stoools or toilet seats, as it may be difficult to get up from low surfaces        Medication List  As of 07/26/2011 10:24 AM   STOP taking these medications         HYDROcodone-acetaminophen 5-325 MG per tablet      ibuprofen 200 MG tablet      multivitamin with minerals Tabs      omega-3 acid ethyl esters 1 G capsule         TAKE these medications         methocarbamol 500 MG tablet   Commonly known as: ROBAXIN   Take 1 tablet (500 mg total) by mouth every 6 (six) hours as needed.      oxyCODONE 5 MG immediate release tablet   Commonly known as: Oxy IR/ROXICODONE   Take 1-2 tablets (5-10 mg total) by mouth every 4 (four) hours as needed for pain.      PARoxetine 20 MG tablet   Commonly known as: PAXIL   Take 20 mg by mouth every morning.      rivaroxaban 10 MG Tabs tablet   Commonly known as: XARELTO   Take 1 tablet (10 mg total) by mouth daily with breakfast. Take Xarelto for two and a half more weeks, then discontinue Xarelto.           Follow-up Information    Follow up with Loanne Drilling, MD. Schedule an appointment as soon as possible for a visit in 2 weeks.   Contact information:   [facility] [address], [address] [place] [address] [phone]          Signed: Patrica Duel 07/26/2011, 10:24 AM

## 2011-08-02 ENCOUNTER — Ambulatory Visit: Payer: BC Managed Care – PPO | Attending: Orthopedic Surgery | Admitting: Physical Therapy

## 2011-08-02 DIAGNOSIS — R269 Unspecified abnormalities of gait and mobility: Secondary | ICD-10-CM | POA: Insufficient documentation

## 2011-08-02 DIAGNOSIS — R5381 Other malaise: Secondary | ICD-10-CM | POA: Insufficient documentation

## 2011-08-02 DIAGNOSIS — M25569 Pain in unspecified knee: Secondary | ICD-10-CM | POA: Insufficient documentation

## 2011-08-02 DIAGNOSIS — IMO0001 Reserved for inherently not codable concepts without codable children: Secondary | ICD-10-CM | POA: Insufficient documentation

## 2011-08-02 DIAGNOSIS — M25669 Stiffness of unspecified knee, not elsewhere classified: Secondary | ICD-10-CM | POA: Insufficient documentation

## 2011-08-04 ENCOUNTER — Ambulatory Visit: Payer: BC Managed Care – PPO | Admitting: Physical Therapy

## 2011-08-06 ENCOUNTER — Ambulatory Visit: Payer: BC Managed Care – PPO | Admitting: Physical Therapy

## 2011-08-09 ENCOUNTER — Ambulatory Visit: Payer: BC Managed Care – PPO | Admitting: Physical Therapy

## 2011-08-11 ENCOUNTER — Ambulatory Visit: Payer: BC Managed Care – PPO | Admitting: Physical Therapy

## 2011-08-13 ENCOUNTER — Ambulatory Visit: Payer: BC Managed Care – PPO | Admitting: Physical Therapy

## 2011-08-16 ENCOUNTER — Ambulatory Visit: Payer: BC Managed Care – PPO | Admitting: Physical Therapy

## 2011-08-18 ENCOUNTER — Ambulatory Visit: Payer: BC Managed Care – PPO | Admitting: Physical Therapy

## 2011-08-20 ENCOUNTER — Ambulatory Visit: Payer: BC Managed Care – PPO | Attending: Orthopedic Surgery | Admitting: Physical Therapy

## 2011-08-20 DIAGNOSIS — R269 Unspecified abnormalities of gait and mobility: Secondary | ICD-10-CM | POA: Insufficient documentation

## 2011-08-20 DIAGNOSIS — R5381 Other malaise: Secondary | ICD-10-CM | POA: Insufficient documentation

## 2011-08-20 DIAGNOSIS — M25669 Stiffness of unspecified knee, not elsewhere classified: Secondary | ICD-10-CM | POA: Insufficient documentation

## 2011-08-20 DIAGNOSIS — IMO0001 Reserved for inherently not codable concepts without codable children: Secondary | ICD-10-CM | POA: Insufficient documentation

## 2011-08-20 DIAGNOSIS — M25569 Pain in unspecified knee: Secondary | ICD-10-CM | POA: Insufficient documentation

## 2011-08-23 ENCOUNTER — Ambulatory Visit: Payer: BC Managed Care – PPO | Admitting: Physical Therapy

## 2011-08-25 ENCOUNTER — Encounter: Payer: BC Managed Care – PPO | Admitting: Physical Therapy

## 2011-08-27 ENCOUNTER — Ambulatory Visit: Payer: BC Managed Care – PPO | Admitting: Physical Therapy

## 2011-08-30 ENCOUNTER — Ambulatory Visit: Payer: BC Managed Care – PPO | Admitting: Physical Therapy

## 2011-09-01 ENCOUNTER — Ambulatory Visit: Payer: BC Managed Care – PPO | Admitting: Physical Therapy

## 2011-09-03 ENCOUNTER — Ambulatory Visit: Payer: BC Managed Care – PPO | Admitting: Physical Therapy

## 2011-09-07 ENCOUNTER — Ambulatory Visit: Payer: BC Managed Care – PPO | Admitting: Physical Therapy

## 2011-09-09 ENCOUNTER — Ambulatory Visit: Payer: BC Managed Care – PPO | Admitting: Physical Therapy

## 2011-09-14 ENCOUNTER — Ambulatory Visit: Payer: BC Managed Care – PPO | Admitting: Physical Therapy

## 2011-09-16 ENCOUNTER — Ambulatory Visit: Payer: BC Managed Care – PPO | Admitting: Physical Therapy

## 2011-09-21 ENCOUNTER — Ambulatory Visit: Payer: BC Managed Care – PPO | Attending: Orthopedic Surgery | Admitting: Physical Therapy

## 2011-09-21 DIAGNOSIS — R269 Unspecified abnormalities of gait and mobility: Secondary | ICD-10-CM | POA: Insufficient documentation

## 2011-09-21 DIAGNOSIS — M25669 Stiffness of unspecified knee, not elsewhere classified: Secondary | ICD-10-CM | POA: Insufficient documentation

## 2011-09-21 DIAGNOSIS — R5381 Other malaise: Secondary | ICD-10-CM | POA: Insufficient documentation

## 2011-09-21 DIAGNOSIS — M25569 Pain in unspecified knee: Secondary | ICD-10-CM | POA: Insufficient documentation

## 2011-09-21 DIAGNOSIS — IMO0001 Reserved for inherently not codable concepts without codable children: Secondary | ICD-10-CM | POA: Insufficient documentation

## 2011-09-23 ENCOUNTER — Ambulatory Visit: Payer: BC Managed Care – PPO | Admitting: Physical Therapy

## 2011-09-28 ENCOUNTER — Ambulatory Visit: Payer: BC Managed Care – PPO | Admitting: Physical Therapy

## 2012-04-13 ENCOUNTER — Encounter: Payer: Self-pay | Admitting: Physician Assistant

## 2012-04-13 ENCOUNTER — Ambulatory Visit (INDEPENDENT_AMBULATORY_CARE_PROVIDER_SITE_OTHER): Payer: BC Managed Care – PPO | Admitting: Physician Assistant

## 2012-04-13 VITALS — BP 120/71 | HR 78 | Temp 98.8°F | Ht 75.0 in | Wt 247.6 lb

## 2012-04-13 DIAGNOSIS — H9319 Tinnitus, unspecified ear: Secondary | ICD-10-CM

## 2012-04-13 DIAGNOSIS — H9313 Tinnitus, bilateral: Secondary | ICD-10-CM

## 2012-04-13 NOTE — Progress Notes (Signed)
  Subjective:    Patient ID: Manuel Galloway, male    DOB: 06-25-1965, 47 y.o.   MRN: [id number]  HPI Ringing in ears for the past 20 - 30 years, starting to become more bothersome; has never been assessed or evaluated; listened to very loud music as a teen; No dizziness    Review of Systems  HENT: Positive for tinnitus. Negative for ear pain.   Neurological: Negative for dizziness, seizures, speech difficulty and light-headedness.  All other systems reviewed and are negative.       Objective:   Physical Exam  Vitals reviewed. Constitutional: He is oriented to person, place, and time. He appears well-developed and well-nourished.  HENT:  Head: Normocephalic and atraumatic.  Right Ear: External ear normal.  Left Ear: External ear normal.  Nose: Nose normal.  Mouth/Throat: Oropharynx is clear and moist.  Eyes: Conjunctivae and EOM are normal. Pupils are equal, round, and reactive to light.  Neck: Normal range of motion. Neck supple. No tracheal deviation present.  Cardiovascular: Normal rate, regular rhythm and normal heart sounds.   Pulmonary/Chest: Effort normal and breath sounds normal.  Neurological: He is alert and oriented to person, place, and time.  Psychiatric: He has a normal mood and affect. His behavior is normal. Judgment and thought content normal.          Assessment & Plan:  Tinnitus, bilateral - Plan: Ambulatory referral to Audiology

## 2012-11-02 ENCOUNTER — Encounter (INDEPENDENT_AMBULATORY_CARE_PROVIDER_SITE_OTHER): Payer: Self-pay

## 2012-11-02 ENCOUNTER — Ambulatory Visit (INDEPENDENT_AMBULATORY_CARE_PROVIDER_SITE_OTHER): Payer: BC Managed Care – PPO | Admitting: Family Medicine

## 2012-11-02 ENCOUNTER — Encounter: Payer: Self-pay | Admitting: Family Medicine

## 2012-11-02 VITALS — BP 128/77 | HR 78 | Temp 98.0°F | Ht 75.0 in | Wt 255.0 lb

## 2012-11-02 DIAGNOSIS — Z0000 Encounter for general adult medical examination without abnormal findings: Secondary | ICD-10-CM

## 2012-11-02 LAB — POCT CBC
Granulocyte percent: 53.7 %G (ref 37–80)
HCT, POC: 42.9 % — AB (ref 43.5–53.7)
Hemoglobin: 14.9 g/dL (ref 14.1–18.1)
Lymph, poc: 3.2 (ref 0.6–3.4)
MCH, POC: 30.2 pg (ref 27–31.2)
MCHC: 34.7 g/dL (ref 31.8–35.4)
MCV: 87.2 fL (ref 80–97)
MPV: 8.4 fL (ref 0–99.8)
POC Granulocyte: 4.1 (ref 2–6.9)
POC LYMPH PERCENT: 41.8 %L (ref 10–50)
Platelet Count, POC: 169 10*3/uL (ref 142–424)
RBC: 4.9 M/uL (ref 4.69–6.13)
RDW, POC: 12.1 %
WBC: 7.7 10*3/uL (ref 4.6–10.2)

## 2012-11-02 MED ORDER — PAROXETINE HCL 20 MG PO TABS: 20.0000 mg | ORAL_TABLET | ORAL | Status: DC

## 2012-11-02 MED ORDER — ALPRAZOLAM 0.5 MG PO TABS: ORAL_TABLET | ORAL | Status: DC

## 2012-11-02 NOTE — Progress Notes (Signed)
  Subjective:    Patient ID: Manuel Galloway, male    DOB: 09-24-1965, 47 y.o.   MRN: [id number]  HPI This 47 y.o. male presents for evaluation of anxiety.  He has hx of GAD and has been off paxil when he had his knee replacement and didn't get back on it and now he is having problems With panic.  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   Review of Systems C/o anxiety No chest pain, SOB, HA, dizziness, vision change, N/V, diarrhea, constipation, dysuria, urinary urgency or frequency, myalgias, arthralgias or rash.     Objective:   Physical Exam  Vital signs noted  Well developed well nourished male.  HEENT - Head atraumatic Normocephalic                Eyes - PERRLA, Conjuctiva - clear Sclera- Clear EOMI                Ears - EAC's Wnl TM's Wnl Gross Hearing WNL                Nose - Nares patent                 Throat - oropharanx wnl Respiratory - Lungs CTA bilateral Cardiac - RRR S1 and S2 without murmur GI - Abdomen soft Nontender and bowel sounds active x 4 Extremities - No edema. Neuro - Grossly intact.      Assessment & Plan:  Routine general medical examination at a health care facility - Plan: POCT CBC, CMP14+EGFR, Lipid panel, PSA, total and free, Thyroid Panel With TSH  Anxiety - Paxil 20mg  one po qd #90 w/3rf, xanax 0.5mg  one half to one po tid prn #60w/3rf  Follow up in 3-4 months  Deatra Canter FNP

## 2012-11-02 NOTE — Patient Instructions (Signed)

## 2012-11-03 LAB — CMP14+EGFR
ALT: 26 IU/L (ref 0–44)
AST: 21 IU/L (ref 0–40)
Albumin/Globulin Ratio: 2.2 (ref 1.1–2.5)
Albumin: 4.7 g/dL (ref 3.5–5.5)
Alkaline Phosphatase: 55 IU/L (ref 39–117)
BUN/Creatinine Ratio: 21 — ABNORMAL HIGH (ref 9–20)
BUN: 18 mg/dL (ref 6–24)
CO2: 24 mmol/L (ref 18–29)
Calcium: 9.4 mg/dL (ref 8.7–10.2)
Chloride: 102 mmol/L (ref 97–108)
Creatinine, Ser: 0.87 mg/dL (ref 0.76–1.27)
GFR calc Af Amer: 117 mL/min/{1.73_m2} (ref >59)
GFR calc non Af Amer: 101 mL/min/{1.73_m2} (ref >59)
Globulin, Total: 2.1 g/dL (ref 1.5–4.5)
Glucose: 99 mg/dL (ref 65–99)
Potassium: 4.6 mmol/L (ref 3.5–5.2)
Sodium: 141 mmol/L (ref 134–144)
Total Bilirubin: 0.5 mg/dL (ref 0.0–1.2)
Total Protein: 6.8 g/dL (ref 6.0–8.5)

## 2012-11-03 LAB — THYROID PANEL WITH TSH
Free Thyroxine Index: 2.1 (ref 1.2–4.9)
T3 Uptake Ratio: 31 % (ref 24–39)
T4, Total: 6.7 ug/dL (ref 4.5–12.0)
TSH: 1.61 u[IU]/mL (ref 0.450–4.500)

## 2012-11-03 LAB — LIPID PANEL
Chol/HDL Ratio: 4.4 ratio units (ref 0.0–5.0)
Cholesterol, Total: 202 mg/dL — ABNORMAL HIGH (ref 100–199)
HDL: 46 mg/dL (ref >39)
LDL Calculated: 125 mg/dL — ABNORMAL HIGH (ref 0–99)
Triglycerides: 157 mg/dL — ABNORMAL HIGH (ref 0–149)
VLDL Cholesterol Cal: 31 mg/dL (ref 5–40)

## 2012-11-03 LAB — PSA, TOTAL AND FREE
PSA, Free Pct: 40 %
PSA, Free: 0.32 ng/mL
PSA: 0.8 ng/mL (ref 0.0–4.0)

## 2012-11-14 ENCOUNTER — Telehealth: Payer: Self-pay | Admitting: Family Medicine

## 2012-11-29 NOTE — Telephone Encounter (Signed)
Pt notified and labs discussed 

## 2013-03-19 ENCOUNTER — Encounter: Payer: Self-pay | Admitting: Family Medicine

## 2013-03-19 ENCOUNTER — Ambulatory Visit (INDEPENDENT_AMBULATORY_CARE_PROVIDER_SITE_OTHER): Payer: BC Managed Care – PPO | Admitting: Family Medicine

## 2013-03-19 VITALS — BP 136/84 | HR 97 | Temp 99.4°F | Ht 75.0 in | Wt 268.0 lb

## 2013-03-19 DIAGNOSIS — F411 Generalized anxiety disorder: Secondary | ICD-10-CM

## 2013-03-19 NOTE — Progress Notes (Signed)
   Subjective:    Patient ID: Kyal Arts, male    DOB: 21-Jul-1965, 48 y.o.   MRN: [id number]  HPI  This 48 y.o. male presents for evaluation of follow up on anxiety and CPE labs from last visit. He had a colonoscopy which was normal at age 68 when he had abdominal pain. He has not had abdominal pain or any GI problems.  Review of Systems No chest pain, SOB, HA, dizziness, vision change, N/V, diarrhea, constipation, dysuria, urinary urgency or frequency, myalgias, arthralgias or rash.     Objective:   Physical Exam  Vital signs noted  Well developed well nourished male.  HEENT - Head atraumatic Normocephalic                Eyes - PERRLA, Conjuctiva - clear Sclera- Clear EOMI                Ears - EAC's Wnl TM's Wnl Gross Hearing WNL                Nose - Nares patent                 Throat - oropharanx wnl Respiratory - Lungs CTA bilateral Cardiac - RRR S1 and S2 without murmur GI - Abdomen soft Nontender and bowel sounds active x 4 Extremities - No edema. Neuro - Grossly intact.      Assessment & Plan:  Anxiety state, unspecified Discussed recent labs with patient and recommend continue current. Wait until next year for colonoscopy.  Lysbeth Penner FNP

## 2013-03-19 NOTE — Patient Instructions (Signed)

## 2013-06-13 ENCOUNTER — Ambulatory Visit (INDEPENDENT_AMBULATORY_CARE_PROVIDER_SITE_OTHER): Payer: BC Managed Care – PPO

## 2013-06-13 ENCOUNTER — Ambulatory Visit (INDEPENDENT_AMBULATORY_CARE_PROVIDER_SITE_OTHER): Payer: BC Managed Care – PPO | Admitting: Family

## 2013-06-13 ENCOUNTER — Encounter: Payer: Self-pay | Admitting: Family

## 2013-06-13 VITALS — BP 127/80 | HR 83 | Temp 99.0°F | Ht 75.0 in | Wt 245.0 lb

## 2013-06-13 DIAGNOSIS — Z23 Encounter for immunization: Secondary | ICD-10-CM

## 2013-06-13 DIAGNOSIS — Z01811 Encounter for preprocedural respiratory examination: Secondary | ICD-10-CM

## 2013-06-13 DIAGNOSIS — Z01818 Encounter for other preprocedural examination: Secondary | ICD-10-CM

## 2013-06-13 LAB — POCT CBC
Granulocyte percent: 63.2 %G (ref 37–80)
HCT, POC: 47.6 % (ref 43.5–53.7)
Hemoglobin: 15.5 g/dL (ref 14.1–18.1)
LYMPH, POC: 3.3 (ref 0.6–3.4)
MCH: 29.1 pg (ref 27–31.2)
MCHC: 32.6 g/dL (ref 31.8–35.4)
MCV: 89.1 fL (ref 80–97)
MPV: 8.5 fL (ref 0–99.8)
PLATELET COUNT, POC: 200 10*3/uL (ref 142–424)
POC Granulocyte: 5.9 (ref 2–6.9)
POC LYMPH PERCENT: 34.8 %L (ref 10–50)
RBC: 5.3 M/uL (ref 4.69–6.13)
RDW, POC: 12.5 %
WBC: 9.4 10*3/uL (ref 4.6–10.2)

## 2013-06-13 NOTE — Patient Instructions (Signed)
Total Knee Replacement Total knee replacement is a procedure to replace your knee joint with an artificial knee joint (prosthetic knee joint). The purpose of this surgery is to reduce pain and improve your knee function. LET YOUR CAREGIVER KNOW ABOUT:   Any allergies you have.  Any medicines you are taking, including vitamins, herbs, eyedrops, over-the-counter medicines, and creams.  Any problems you have had with the use of anesthetics.  Family history of problems with the use of anesthetics.  Any blood disorders you have, including bleeding problems or clotting problems.  Previous surgeries you have had. RISKS AND COMPLICATIONS  Generally, total knee replacement is a safe procedure. However, as with any surgical procedure, complications can occur. Possible complications associated with total knee replacement include:  Loss of range of motion of the knee or instability.  Loosening of the prosthesis.  Infection.  Persistent pain. BEFORE THE PROCEDURE   Your caregiver will instruct you when you need to stop eating and drinking.  Ask your caregiver if you need to change or stop any regular medicines. PROCEDURE  Just before the procedure you will receive medicine that will make you drowsy (sedative). This will be given through a tube that is inserted into one of your veins (intravenous [IV] tube). Then you will either receive medicine to block pain from the waist down through your legs (spinal block) or medicine to also receive medicine to make you fall asleep (general anesthetic). You may also receive medicine to block feeling in your leg (nerve block) to help ease pain after surgery. An incision will be made in your knee. Your surgeon will take out any damaged cartilage and bone by sawing off the damaged surfaces. Then the surgeon will put a new metal liner over the sawed off portion of your thigh bone (femur) and a plastic liner over the sawed off portion of one of the bones of your  lower leg (tibia). This is to restore alignment and function to your knee. A plastic piece is often used to restore the surface of your knee cap. AFTER THE PROCEDURE  You will be taken to the recovery area. You may have drainage tubes to drain excess fluid from your knee. These tubes attach to a device that removes these fluids. Once you are awake, stable, and taking fluids well, you will be taken to your hospital room. You will receive physical therapy as prescribed by your caregiver. The length of your stay in the hospital after a knee replacement is 2 4 days. Your surgeon may recommend that you spend time (usually an additional 10 14 days) in an extended-care facility to help you begin walking again and improve your range of motion before you go home. You may also be prescribed blood-thinning medicine to decrease your risk of developing blood clots in your leg. Document Released: 04/12/2000 Document Revised: 07/06/2011 Document Reviewed: 02/14/2011 [employer] Patient Information 2014 [employer].  

## 2013-06-13 NOTE — Progress Notes (Signed)
   Subjective:    Patient ID: Manuel Galloway, male    DOB: 1965/05/19, 48 y.o.   MRN: [id number]  HPI Pt presents to office for surgical clarence for a total right knee replacement on Aug. 31 2015 with [facility]. Pt denies any chest pain, SOB, palpations, or pain (other in right knee).    Review of Systems  HENT: Negative.   Respiratory: Negative.   Cardiovascular: Negative.   Gastrointestinal: Negative.   Musculoskeletal: Negative.   Neurological: Negative.   Psychiatric/Behavioral: Negative.   All other systems reviewed and are negative.      Objective:   Physical Exam  Vitals reviewed. Constitutional: He is oriented to person, place, and time. He appears well-developed and well-nourished. No distress.  HENT:  Head: Normocephalic.  Right Ear: External ear normal.  Left Ear: External ear normal.  Mouth/Throat: Oropharynx is clear and moist.  Eyes: Pupils are equal, round, and reactive to light. Right eye exhibits no discharge. Left eye exhibits no discharge.  Neck: Normal range of motion. Neck supple. No thyromegaly present.  Cardiovascular: Normal rate, regular rhythm, normal heart sounds and intact distal pulses.   No murmur heard. Pulmonary/Chest: Effort normal and breath sounds normal. No respiratory distress. He has no wheezes.  Abdominal: Soft. Bowel sounds are normal. He exhibits no distension. There is no tenderness.  Musculoskeletal: Normal range of motion. He exhibits no edema and no tenderness.  Neurological: He is alert and oriented to person, place, and time. He has normal reflexes. No cranial nerve deficit.  Skin: Skin is warm and dry. No rash noted. No erythema.  Psychiatric: He has a normal mood and affect. His behavior is normal. Judgment and thought content normal.      BP 127/80  Pulse 83  Temp(Src) 99 F (37.2 C) (Oral)  Ht $R'6\' 3"'Oi$  (1.905 m)  Wt 245 lb (111.131 kg)  BMI 30.62 kg/m2  Chest X-ray WNL-Preliminary reading by Evelina Dun, FNP [facility]     Assessment & Plan:  1. Pre-op chest exam - DG Chest 2 View; Future - POCT CBC - CMP14+EGFR  2. Pre-op testing - EKG 12-Lead   Continue all meds Labs pending Health Maintenance reviewed-TDAP given today Diet and exercise encouraged RTO prn   Evelina Dun, FNP

## 2013-06-14 LAB — CMP14+EGFR
ALK PHOS: 68 IU/L (ref 39–117)
ALT: 25 IU/L (ref 0–44)
AST: 17 IU/L (ref 0–40)
Albumin/Globulin Ratio: 2 (ref 1.1–2.5)
Albumin: 4.7 g/dL (ref 3.5–5.5)
BUN / CREAT RATIO: 14 (ref 9–20)
BUN: 12 mg/dL (ref 6–24)
CHLORIDE: 102 mmol/L (ref 97–108)
CO2: 24 mmol/L (ref 18–29)
Calcium: 9.6 mg/dL (ref 8.7–10.2)
Creatinine, Ser: 0.85 mg/dL (ref 0.76–1.27)
GFR calc Af Amer: 117 mL/min/{1.73_m2} (ref >59)
GFR calc non Af Amer: 102 mL/min/{1.73_m2} (ref >59)
Globulin, Total: 2.4 g/dL (ref 1.5–4.5)
Glucose: 103 mg/dL — ABNORMAL HIGH (ref 65–99)
Potassium: 4.3 mmol/L (ref 3.5–5.2)
SODIUM: 139 mmol/L (ref 134–144)
Total Bilirubin: 0.6 mg/dL (ref 0.0–1.2)
Total Protein: 7.1 g/dL (ref 6.0–8.5)

## 2013-06-20 NOTE — Progress Notes (Signed)
Need orders in EPIC when able please - pt coming for preop [facility]RI 06/29/13 - thank you

## 2013-06-25 ENCOUNTER — Encounter (HOSPITAL_COMMUNITY): Payer: Self-pay | Admitting: Pharmacy Technician

## 2013-06-28 ENCOUNTER — Other Ambulatory Visit (HOSPITAL_COMMUNITY): Payer: Self-pay | Admitting: Orthopedic Surgery

## 2013-06-28 NOTE — Progress Notes (Signed)
Need surgery orders asap, pre op is 06-29-13 800 am, thanks

## 2013-06-28 NOTE — Progress Notes (Signed)
ekg 06-13-13 epic Chest 2 view xray 06-13-13 epic Medical clearance note christy haws fnp 06-13-13 epic

## 2013-06-28 NOTE — Patient Instructions (Addendum)
[place]  06/28/2013   Your procedure is scheduled on: Monday June 29th, 2015  Report to [facility] Main Entrance and follow signs to  [place] at 735 AM.  Call this number if you have problems the morning of surgery [phone]   Remember:  Do not eat food or drink liquids :After Midnight.     Take these medicines the morning of surgery with A SIP OF WATER: NONE                               You may not have any metal on your body including hair pins and piercings  Do not wear jewelry, make-up, lotions, powders, or deodorant.   Men may shave face and neck.  Do not bring valuables to the hospital. [place].  Contacts, dentures or bridgework may not be worn into surgery.  Leave suitcase in the car. After surgery it may be brought to your room.  For patients admitted to the hospital, checkout time is 11:00 AM the day of discharge.   Patients discharged the day of surgery will not be allowed to drive home.  Name and phone number of your driver:  Special Instructions: N/A ________________________________________________________________________  [facility] - Preparing for Surgery Before surgery, you can play an important role.  Because skin is not sterile, your skin needs to be as free of germs as possible.  You can reduce the number of germs on your skin by washing with CHG (chlorahexidine gluconate) soap before surgery.  CHG is an antiseptic cleaner which kills germs and bonds with the skin to continue killing germs even after washing. Please DO NOT use if you have an allergy to CHG or antibacterial soaps.  If your skin becomes reddened/irritated stop using the CHG and inform your nurse when you arrive at Short Stay. Do not shave (including legs and underarms) for at least 48 hours prior to the first CHG shower.  You may shave your face/neck. Please follow these instructions carefully:  1.  Shower with CHG Soap the night  before surgery and the  morning of Surgery.  2.  If you choose to wash your hair, wash your hair first as usual with your  normal  shampoo.  3.  After you shampoo, rinse your hair and body thoroughly to remove the  shampoo.                           4.  Use CHG as you would any other liquid soap.  You can apply chg directly  to the skin and wash                       Gently with a scrungie or clean washcloth.  5.  Apply the CHG Soap to your body ONLY FROM THE NECK DOWN.   Do not use on face/ open                           Wound or open sores. Avoid contact with eyes, ears mouth and genitals (private parts).                       Wash face,  Genitals (private parts) with your normal soap.  6.  Wash thoroughly, paying special attention to the area where your surgery  will be performed.  7.  Thoroughly rinse your body with warm water from the neck down.  8.  DO NOT shower/wash with your normal soap after using and rinsing off  the CHG Soap.                9.  Pat yourself dry with a clean towel.            10.  Wear clean pajamas.            11.  Place clean sheets on your bed the night of your first shower and do not  sleep with pets. Day of Surgery : Do not apply any lotions/deodorants the morning of surgery.  Please wear clean clothes to the hospital/surgery center.  FAILURE TO FOLLOW THESE INSTRUCTIONS MAY RESULT IN THE CANCELLATION OF YOUR SURGERY PATIENT SIGNATURE_________________________________  NURSE SIGNATURE__________________________________  ________________________________________________________________________   Adam Phenix  An incentive spirometer is a tool that can help keep your lungs clear and active. This tool measures how well you are filling your lungs with each breath. Taking long deep breaths may help reverse or decrease the chance of developing breathing (pulmonary) problems (especially infection) following:  A long period of time when you are  unable to move or be active. BEFORE THE PROCEDURE   If the spirometer includes an indicator to show your best effort, your nurse or respiratory therapist will set it to a desired goal.  If possible, sit up straight or lean slightly forward. Try not to slouch.  Hold the incentive spirometer in an upright position. INSTRUCTIONS FOR USE  1. Sit on the edge of your bed if possible, or sit up as far as you can in bed or on a chair. 2. Hold the incentive spirometer in an upright position. 3. Breathe out normally. 4. Place the mouthpiece in your mouth and seal your lips tightly around it. 5. Breathe in slowly and as deeply as possible, raising the piston or the ball toward the top of the column. 6. Hold your breath for 3-5 seconds or for as long as possible. Allow the piston or ball to fall to the bottom of the column. 7. Remove the mouthpiece from your mouth and breathe out normally. 8. Rest for a few seconds and repeat Steps 1 through 7 at least 10 times every 1-2 hours when you are awake. Take your time and take a few normal breaths between deep breaths. 9. The spirometer may include an indicator to show your best effort. Use the indicator as a goal to work toward during each repetition. 10. After each set of 10 deep breaths, practice coughing to be sure your lungs are clear. If you have an incision (the cut made at the time of surgery), support your incision when coughing by placing a pillow or rolled up towels firmly against it. Once you are able to get out of bed, walk around indoors and cough well. You may stop using the incentive spirometer when instructed by your caregiver.  RISKS AND COMPLICATIONS  Take your time so you do not get dizzy or light-headed.  If you are in pain, you may need to take or ask for pain medication before doing incentive spirometry. It is harder to take a deep breath if you are having pain. AFTER USE  Rest and breathe slowly and easily.  It can be helpful to  keep track of a log of  your progress. Your caregiver can provide you with a simple table to help with this. If you are using the spirometer at home, follow these instructions: [place] IF:   You are having difficultly using the spirometer.  You have trouble using the spirometer as often as instructed.  Your pain medication is not giving enough relief while using the spirometer.  You develop fever of 100.5 F (38.1 C) or higher. SEEK IMMEDIATE MEDICAL CARE IF:   You cough up bloody sputum that had not been present before.  You develop fever of 102 F (38.9 C) or greater.  You develop worsening pain at or near the incision site. MAKE SURE YOU:   Understand these instructions.  Will watch your condition.  Will get help right away if you are not doing well or get worse. Document Released: 05/17/2006 Document Revised: 03/29/2011 Document Reviewed: 07/18/2006 ExitCare Patient Information 2014 ExitCare, [place].   ________________________________________________________________________  WHAT IS A BLOOD TRANSFUSION? Blood Transfusion Information  A transfusion is the replacement of blood or some of its parts. Blood is made up of multiple cells which provide different functions.  Red blood cells carry oxygen and are used for blood loss replacement.  White blood cells fight against infection.  Platelets control bleeding.  Plasma helps clot blood.  Other blood products are available for specialized needs, such as hemophilia or other clotting disorders. BEFORE THE TRANSFUSION  Who gives blood for transfusions?   Healthy volunteers who are fully evaluated to make sure their blood is safe. This is blood bank blood. Transfusion therapy is the safest it has ever been in the practice of medicine. Before blood is taken from a donor, a complete history is taken to make sure that person has no history of diseases nor engages in risky social behavior (examples are intravenous drug  use or sexual activity with multiple partners). The donor's travel history is screened to minimize risk of transmitting infections, such as malaria. The donated blood is tested for signs of infectious diseases, such as HIV and hepatitis. The blood is then tested to be sure it is compatible with you in order to minimize the chance of a transfusion reaction. If you or a relative donates blood, this is often done in anticipation of surgery and is not appropriate for emergency situations. It takes many days to process the donated blood. RISKS AND COMPLICATIONS Although transfusion therapy is very safe and saves many lives, the main dangers of transfusion include:   Getting an infectious disease.  Developing a transfusion reaction. This is an allergic reaction to something in the blood you were given. Every precaution is taken to prevent this. The decision to have a blood transfusion has been considered carefully by your caregiver before blood is given. Blood is not given unless the benefits outweigh the risks. AFTER THE TRANSFUSION  Right after receiving a blood transfusion, you will usually feel much better and more energetic. This is especially true if your red blood cells have gotten low (anemic). The transfusion raises the level of the red blood cells which carry oxygen, and this usually causes an energy increase.  The nurse administering the transfusion will monitor you carefully for complications. HOME CARE INSTRUCTIONS  No special instructions are needed after a transfusion. You may find your energy is better. Speak with your caregiver about any limitations on activity for underlying diseases you may have. SEEK MEDICAL CARE IF:   Your condition is not improving after your transfusion.  You develop redness or  irritation at the intravenous (IV) site. SEEK IMMEDIATE MEDICAL CARE IF:  Any of the following symptoms occur over the next 12 hours:  Shaking chills.  You have a temperature by mouth  above 102 F (38.9 C), not controlled by medicine.  Chest, back, or muscle pain.  People around you feel you are not acting correctly or are confused.  Shortness of breath or difficulty breathing.  Dizziness and fainting.  You get a rash or develop hives.  You have a decrease in urine output.  Your urine turns a dark color or changes to pink, red, or brown. Any of the following symptoms occur over the next 10 days:  You have a temperature by mouth above 102 F (38.9 C), not controlled by medicine.  Shortness of breath.  Weakness after normal activity.  The white part of the eye turns yellow (jaundice).  You have a decrease in the amount of urine or are urinating less often.  Your urine turns a dark color or changes to pink, red, or brown. Document Released: 01/02/2000 Document Revised: 03/29/2011 Document Reviewed: 08/21/2007 [facility] Patient Information 2014 [place], [place].  _______________________________________________________________________

## 2013-06-29 ENCOUNTER — Other Ambulatory Visit: Payer: Self-pay | Admitting: Surgical

## 2013-06-29 ENCOUNTER — Encounter (HOSPITAL_COMMUNITY): Payer: Self-pay

## 2013-06-29 ENCOUNTER — Encounter (HOSPITAL_COMMUNITY)
Admission: RE | Admit: 2013-06-29 | Discharge: 2013-06-29 | Disposition: A | Discharge status: Home / Self Care | Payer: BC Managed Care – PPO | Source: Ambulatory Visit | Attending: Orthopedic Surgery | Admitting: Orthopedic Surgery

## 2013-06-29 DIAGNOSIS — Z01812 Encounter for preprocedural laboratory examination: Secondary | ICD-10-CM | POA: Insufficient documentation

## 2013-06-29 DIAGNOSIS — Z01818 Encounter for other preprocedural examination: Secondary | ICD-10-CM | POA: Insufficient documentation

## 2013-06-29 HISTORY — DX: Major depressive disorder, single episode, unspecified: F32.9

## 2013-06-29 HISTORY — DX: Anxiety disorder, unspecified: F41.9

## 2013-06-29 HISTORY — DX: Depression, unspecified: F32.A

## 2013-06-29 LAB — CBC
HEMATOCRIT: 43 % (ref 39.0–52.0)
HEMOGLOBIN: 15 g/dL (ref 13.0–17.0)
MCH: 30.4 pg (ref 26.0–34.0)
MCHC: 34.9 g/dL (ref 30.0–36.0)
MCV: 87 fL (ref 78.0–100.0)
Platelets: 182 10*3/uL (ref 150–400)
RBC: 4.94 MIL/uL (ref 4.22–5.81)
RDW: 12.4 % (ref 11.5–15.5)
WBC: 7.6 10*3/uL (ref 4.0–10.5)

## 2013-06-29 LAB — URINALYSIS, ROUTINE W REFLEX MICROSCOPIC
Bilirubin Urine: NEGATIVE
Glucose, UA: NEGATIVE mg/dL
Hgb urine dipstick: NEGATIVE
Ketones, ur: NEGATIVE mg/dL
LEUKOCYTES UA: NEGATIVE
Nitrite: NEGATIVE
PROTEIN: NEGATIVE mg/dL
Specific Gravity, Urine: 1.019 (ref 1.005–1.030)
UROBILINOGEN UA: 1 mg/dL (ref 0.0–1.0)
pH: 7 (ref 5.0–8.0)

## 2013-06-29 LAB — BASIC METABOLIC PANEL
BUN: 15 mg/dL (ref 6–23)
CHLORIDE: 104 meq/L (ref 96–112)
CO2: 26 mEq/L (ref 19–32)
Calcium: 9.5 mg/dL (ref 8.4–10.5)
Creatinine, Ser: 0.79 mg/dL (ref 0.50–1.35)
GFR calc non Af Amer: >90 mL/min (ref >90)
Glucose, Bld: 114 mg/dL — ABNORMAL HIGH (ref 70–99)
POTASSIUM: 4.3 meq/L (ref 3.7–5.3)
SODIUM: 140 meq/L (ref 137–147)

## 2013-06-29 LAB — SURGICAL PCR SCREEN
MRSA, PCR: NEGATIVE
STAPHYLOCOCCUS AUREUS: NEGATIVE

## 2013-06-29 LAB — PROTIME-INR
INR: 0.99 (ref 0.00–1.49)
PROTHROMBIN TIME: 12.9 s (ref 11.6–15.2)

## 2013-06-29 LAB — APTT: aPTT: 28 seconds (ref 24–37)

## 2013-07-04 ENCOUNTER — Other Ambulatory Visit: Payer: Self-pay | Admitting: Surgical

## 2013-07-04 NOTE — H&P (Signed)
TOTAL KNEE ADMISSION H&P  Patient is being admitted for right total knee arthroplasty.  Subjective:  Chief Complaint:right knee pain.  HPI: Manuel Galloway, 48 y.o. male, has a history of pain and functional disability in the right knee due to arthritis and has failed non-surgical conservative treatments for greater than 12 weeks to includeNSAID's and/or analgesics, corticosteriod injections, flexibility and strengthening excercises and activity modification.  Onset of symptoms was gradual, starting 5 years ago with gradually worsening course since that time. The patient noted prior procedures on the knee to include  arthroscopy and menisectomy on the right knee(s).  Patient currently rates pain in the right knee(s) at 7 out of 10 with activity. Patient has night pain, worsening of pain with activity and weight bearing, pain that interferes with activities of daily living, pain with passive range of motion, crepitus and joint swelling.  Patient has evidence of periarticular osteophytes and joint space narrowing by imaging studies.  There is no active infection.  Patient Active Problem List   Diagnosis Date Noted  . Postop Hyponatremia 07/16/2011  . Postop Hypokalemia 07/16/2011  . OA (osteoarthritis) of knee 07/14/2011   Past Medical History  Diagnosis Date  . Arthritis   . Anxiety   . Depression     Past Surgical History  Procedure Laterality Date  . Nasal septum surgery  2005  . Knee arthroscopy  1986 and 2007    left  . Knee arthroscopy  1999    right  . Total knee arthroplasty  07/14/2011    Procedure: TOTAL KNEE ARTHROPLASTY;  Surgeon: Gearlean Alf, MD;  Location: [facility] ORS;  Service: Orthopedics;  Laterality: Left;  . Replacement total knee Left      Current outpatient prescriptions: ALPRAZolam (XANAX) 0.5 MG tablet, Take 0.25 mg by mouth every other day., Disp: , Rfl: ;   HYDROcodone-acetaminophen (NORCO/VICODIN) 5-325 MG per tablet, Take 1 tablet by mouth every 6 (six) hours  as needed for moderate pain., Disp: , Rfl: ;   ibuprofen (ADVIL,MOTRIN) 200 MG tablet, Take 600 mg by mouth every 6 (six) hours as needed for mild pain., Disp: , Rfl:  PARoxetine (PAXIL) 20 MG tablet, Take 10 mg by mouth every other day., Disp: , Rfl:   Allergies  Allergen Reactions  . Fish Allergy Shortness Of Breath and Swelling    History  Substance Use Topics  . Smoking status: Former Research scientist (life sciences)  . Smokeless tobacco: Former Systems developer    Quit date: 07/30/2000  . Alcohol Use: Yes     Comment: occassionally-beer    Family History  Problem Relation Age of Onset  . Cancer Father      Review of Systems  Constitutional: Negative.   HENT: Negative.   Eyes: Negative.   Respiratory: Negative.   Cardiovascular: Negative.   Gastrointestinal: Negative.   Genitourinary: Negative.   Musculoskeletal: Positive for joint pain. Negative for back pain, falls, myalgias and neck pain.       Right knee pain  Skin: Negative.   Neurological: Negative.   Endo/Heme/Allergies: Negative.   Psychiatric/Behavioral: Negative.     Objective:  Physical Exam  Constitutional: He is oriented to person, place, and time. He appears well-developed and well-nourished. No distress.  HENT:  Head: Normocephalic and atraumatic.  Right Ear: External ear normal.  Left Ear: External ear normal.  Nose: Nose normal.  Mouth/Throat: Oropharynx is clear and moist.  Eyes: Conjunctivae and EOM are normal.  Neck: Normal range of motion. Neck supple.  Cardiovascular: Normal rate,  regular rhythm, normal heart sounds and intact distal pulses.   No murmur heard. Respiratory: Effort normal and breath sounds normal. No respiratory distress. He has no wheezes.  GI: Soft. Bowel sounds are normal. He exhibits no distension. There is no tenderness.  Musculoskeletal:       Right hip: Normal.       Left hip: Normal.       Right knee: He exhibits decreased range of motion and swelling. He exhibits no effusion and no erythema.  Tenderness found. Medial joint line tenderness noted. No lateral joint line tenderness noted.       Left knee: Normal.       Right lower leg: He exhibits no tenderness and no swelling.       Left lower leg: He exhibits no tenderness and no swelling.  Right knee shows no effusion. His range of motion is about five to 120 degrees. He has marked crepitus on range of motion. He is tender medially. There is no lateral tenderness or any instability noted.  Neurological: He is alert and oriented to person, place, and time. He has normal strength and normal reflexes. No sensory deficit.  Skin: No rash noted. He is not diaphoretic. No erythema.  Psychiatric: He has a normal mood and affect. His behavior is normal.    Vitals Weight: 245.8 lb Height: 75 in Body Surface Area: 2.43 m Body Mass Index: 30.72 kg/m Pulse: 74 (Regular) BP: 126/80 (Sitting, Left Arm, Standard)  Imaging Review Plain radiographs demonstrate severe degenerative joint disease of the right knee(s). The overall alignment ismild varus. The bone quality appears to be good for age and reported activity level.  Assessment/Plan:  End stage arthritis, right knee   The patient history, physical examination, clinical judgment of the provider and imaging studies are consistent with end stage degenerative joint disease of the right knee(s) and total knee arthroplasty is deemed medically necessary. The treatment options including medical management, injection therapy arthroscopy and arthroplasty were discussed at length. The risks and benefits of total knee arthroplasty were presented and reviewed. The risks due to aseptic loosening, infection, stiffness, patella tracking problems, thromboembolic complications and other imponderables were discussed. The patient acknowledged the explanation, agreed to proceed with the plan and consent was signed. Patient is being admitted for inpatient treatment for surgery, pain control, PT, OT,  prophylactic antibiotics, VTE prophylaxis, progressive ambulation and ADL's and discharge planning. The patient is planning to be discharged home with home health services  Patient to receive [place], PA-C

## 2013-07-07 ENCOUNTER — Other Ambulatory Visit: Payer: Self-pay | Admitting: Family Medicine

## 2013-07-10 NOTE — Telephone Encounter (Signed)
Patient last seen in office on 06-13-13. Rx last filled on 05-01-13 for #[phone]. Please advise . If approved please route to  Pool A so nurse can phone in to pharmacy

## 2013-07-10 NOTE — Telephone Encounter (Signed)
Rx called in 

## 2013-07-16 ENCOUNTER — Encounter: Payer: Self-pay | Admitting: Gastroenterology

## 2013-07-16 ENCOUNTER — Encounter (HOSPITAL_COMMUNITY)
Admission: RE | Disposition: A | Discharge status: Home Health | Payer: Self-pay | Source: Ambulatory Visit | Attending: Orthopedic Surgery

## 2013-07-16 ENCOUNTER — Encounter (HOSPITAL_COMMUNITY): Payer: Self-pay

## 2013-07-16 ENCOUNTER — Encounter (HOSPITAL_COMMUNITY): Payer: BC Managed Care – PPO | Admitting: Anesthesiology

## 2013-07-16 ENCOUNTER — Inpatient Hospital Stay (HOSPITAL_COMMUNITY): Payer: BC Managed Care – PPO | Admitting: Anesthesiology

## 2013-07-16 ENCOUNTER — Inpatient Hospital Stay (HOSPITAL_COMMUNITY)
Admission: RE | Admit: 2013-07-16 | Discharge: 2013-07-18 | DRG: 470 | Disposition: A | Discharge status: Home Health | Payer: BC Managed Care – PPO | Source: Ambulatory Visit | Attending: Orthopedic Surgery | Admitting: Orthopedic Surgery

## 2013-07-16 DIAGNOSIS — Z87891 Personal history of nicotine dependence: Secondary | ICD-10-CM

## 2013-07-16 DIAGNOSIS — Z96651 Presence of right artificial knee joint: Secondary | ICD-10-CM

## 2013-07-16 DIAGNOSIS — F411 Generalized anxiety disorder: Secondary | ICD-10-CM | POA: Diagnosis present

## 2013-07-16 DIAGNOSIS — Z791 Long term (current) use of non-steroidal anti-inflammatories (NSAID): Secondary | ICD-10-CM

## 2013-07-16 DIAGNOSIS — Z01812 Encounter for preprocedural laboratory examination: Secondary | ICD-10-CM

## 2013-07-16 DIAGNOSIS — M1711 Unilateral primary osteoarthritis, right knee: Secondary | ICD-10-CM

## 2013-07-16 DIAGNOSIS — Z96659 Presence of unspecified artificial knee joint: Secondary | ICD-10-CM

## 2013-07-16 DIAGNOSIS — M179 Osteoarthritis of knee, unspecified: Secondary | ICD-10-CM | POA: Diagnosis present

## 2013-07-16 DIAGNOSIS — Z79899 Other long term (current) drug therapy: Secondary | ICD-10-CM

## 2013-07-16 DIAGNOSIS — F329 Major depressive disorder, single episode, unspecified: Secondary | ICD-10-CM | POA: Diagnosis present

## 2013-07-16 DIAGNOSIS — M25469 Effusion, unspecified knee: Secondary | ICD-10-CM | POA: Diagnosis present

## 2013-07-16 DIAGNOSIS — Z91013 Allergy to seafood: Secondary | ICD-10-CM

## 2013-07-16 DIAGNOSIS — M1710 Unilateral primary osteoarthritis, unspecified knee: Principal | ICD-10-CM | POA: Diagnosis present

## 2013-07-16 DIAGNOSIS — D62 Acute posthemorrhagic anemia: Secondary | ICD-10-CM | POA: Diagnosis not present

## 2013-07-16 DIAGNOSIS — F3289 Other specified depressive episodes: Secondary | ICD-10-CM | POA: Diagnosis present

## 2013-07-16 HISTORY — PX: TOTAL KNEE ARTHROPLASTY: SHX125

## 2013-07-16 LAB — TYPE AND SCREEN
ABO/RH(D): A POS
Antibody Screen: NEGATIVE

## 2013-07-16 SURGERY — ARTHROPLASTY, KNEE, TOTAL
Anesthesia: Spinal | Site: Knee | Laterality: Right

## 2013-07-16 MED ORDER — FENTANYL CITRATE 0.05 MG/ML IJ SOLN
INTRAMUSCULAR | Status: AC
  Filled 2013-07-16: qty 2

## 2013-07-16 MED ORDER — METOCLOPRAMIDE HCL 10 MG PO TABS: 5.0000 mg | ORAL_TABLET | Freq: Three times a day (TID) | ORAL | Status: DC | PRN

## 2013-07-16 MED ORDER — BUPIVACAINE HCL (PF) 0.25 % IJ SOLN
INTRAMUSCULAR | Status: AC
  Filled 2013-07-16: qty 30

## 2013-07-16 MED ORDER — CEFAZOLIN SODIUM-DEXTROSE 2-3 GM-% IV SOLR
2.0000 g | Freq: Four times a day (QID) | INTRAVENOUS | Status: AC
  Administered 2013-07-16 – 2013-07-17 (×2): 2 g via INTRAVENOUS
  Filled 2013-07-16 (×2): qty 50

## 2013-07-16 MED ORDER — ONDANSETRON HCL 4 MG/2ML IJ SOLN
INTRAMUSCULAR | Status: AC
  Filled 2013-07-16: qty 2

## 2013-07-16 MED ORDER — PROMETHAZINE HCL 25 MG/ML IJ SOLN: 6.2500 mg | INTRAMUSCULAR | Status: DC | PRN

## 2013-07-16 MED ORDER — HYDROMORPHONE HCL PF 1 MG/ML IJ SOLN
INTRAMUSCULAR | Status: AC
  Filled 2013-07-16: qty 1

## 2013-07-16 MED ORDER — DIPHENHYDRAMINE HCL 12.5 MG/5ML PO ELIX
12.5000 mg | ORAL_SOLUTION | ORAL | Status: DC | PRN
  Administered 2013-07-16 – 2013-07-18 (×7): 25 mg via ORAL
  Filled 2013-07-16 (×7): qty 10

## 2013-07-16 MED ORDER — DOCUSATE SODIUM 100 MG PO CAPS
100.0000 mg | ORAL_CAPSULE | Freq: Two times a day (BID) | ORAL | Status: DC
Start: 2013-07-16 — End: 2013-07-18
  Administered 2013-07-16 – 2013-07-18 (×5): 100 mg via ORAL

## 2013-07-16 MED ORDER — MIDAZOLAM HCL 5 MG/5ML IJ SOLN
INTRAMUSCULAR | Status: DC | PRN
  Administered 2013-07-16: 2 mg via INTRAVENOUS

## 2013-07-16 MED ORDER — MIDAZOLAM HCL 2 MG/2ML IJ SOLN
INTRAMUSCULAR | Status: AC
  Filled 2013-07-16: qty 2

## 2013-07-16 MED ORDER — PROPOFOL 10 MG/ML IV BOLUS
INTRAVENOUS | Status: AC
  Filled 2013-07-16: qty 20

## 2013-07-16 MED ORDER — MORPHINE SULFATE 2 MG/ML IJ SOLN
1.0000 mg | INTRAMUSCULAR | Status: DC | PRN
  Administered 2013-07-16 – 2013-07-17 (×2): 2 mg via INTRAVENOUS
  Filled 2013-07-16 (×2): qty 1

## 2013-07-16 MED ORDER — ACETAMINOPHEN 325 MG PO TABS
650.0000 mg | ORAL_TABLET | Freq: Four times a day (QID) | ORAL | Status: DC | PRN
  Administered 2013-07-17 – 2013-07-18 (×2): 650 mg via ORAL
  Filled 2013-07-16 (×2): qty 2

## 2013-07-16 MED ORDER — FLEET ENEMA 7-19 GM/118ML RE ENEM: 1.0000 | ENEMA | Freq: Once | RECTAL | Status: AC | PRN

## 2013-07-16 MED ORDER — PROPOFOL INFUSION 10 MG/ML OPTIME
INTRAVENOUS | Status: DC | PRN
  Administered 2013-07-16: 140 ug/kg/min via INTRAVENOUS

## 2013-07-16 MED ORDER — DEXAMETHASONE 6 MG PO TABS
10.0000 mg | ORAL_TABLET | Freq: Every day | ORAL | Status: AC
  Administered 2013-07-17: 10 mg via ORAL
  Filled 2013-07-16: qty 1

## 2013-07-16 MED ORDER — ONDANSETRON HCL 4 MG/2ML IJ SOLN: 4.0000 mg | Freq: Four times a day (QID) | INTRAMUSCULAR | Status: DC | PRN

## 2013-07-16 MED ORDER — CEFAZOLIN SODIUM-DEXTROSE 2-3 GM-% IV SOLR
2.0000 g | Freq: Once | INTRAVENOUS | Status: AC
  Administered 2013-07-16: 2 g via INTRAVENOUS

## 2013-07-16 MED ORDER — ACETAMINOPHEN 650 MG RE SUPP: 650.0000 mg | Freq: Four times a day (QID) | RECTAL | Status: DC | PRN

## 2013-07-16 MED ORDER — CEFAZOLIN SODIUM-DEXTROSE 2-3 GM-% IV SOLR
INTRAVENOUS | Status: AC
  Filled 2013-07-16: qty 50

## 2013-07-16 MED ORDER — FENTANYL CITRATE 0.05 MG/ML IJ SOLN
INTRAMUSCULAR | Status: DC | PRN
  Administered 2013-07-16: 100 ug via INTRAVENOUS

## 2013-07-16 MED ORDER — TRANEXAMIC ACID 100 MG/ML IV SOLN
1000.0000 mg | INTRAVENOUS | Status: AC
  Administered 2013-07-16: 1000 mg via INTRAVENOUS
  Filled 2013-07-16: qty 10

## 2013-07-16 MED ORDER — ONDANSETRON HCL 4 MG/2ML IJ SOLN
INTRAMUSCULAR | Status: DC | PRN
  Administered 2013-07-16: 4 mg via INTRAVENOUS

## 2013-07-16 MED ORDER — DEXAMETHASONE SODIUM PHOSPHATE 10 MG/ML IJ SOLN
INTRAMUSCULAR | Status: AC
  Filled 2013-07-16: qty 1

## 2013-07-16 MED ORDER — ACETAMINOPHEN 500 MG PO TABS
1000.0000 mg | ORAL_TABLET | Freq: Four times a day (QID) | ORAL | Status: AC
  Administered 2013-07-16 – 2013-07-17 (×4): 1000 mg via ORAL
  Filled 2013-07-16 (×5): qty 2

## 2013-07-16 MED ORDER — METHOCARBAMOL 1000 MG/10ML IJ SOLN
500.0000 mg | Freq: Four times a day (QID) | INTRAVENOUS | Status: DC | PRN
  Administered 2013-07-16: 500 mg via INTRAVENOUS
  Filled 2013-07-16: qty 5

## 2013-07-16 MED ORDER — PHENOL 1.4 % MT LIQD: 1.0000 | OROMUCOSAL | Status: DC | PRN

## 2013-07-16 MED ORDER — DEXAMETHASONE SODIUM PHOSPHATE 10 MG/ML IJ SOLN
10.0000 mg | Freq: Every day | INTRAMUSCULAR | Status: AC
  Filled 2013-07-16: qty 1

## 2013-07-16 MED ORDER — SODIUM CHLORIDE 0.9 % IJ SOLN
INTRAMUSCULAR | Status: DC | PRN
  Administered 2013-07-16: 30 mL

## 2013-07-16 MED ORDER — ONDANSETRON HCL 4 MG PO TABS: 4.0000 mg | ORAL_TABLET | Freq: Four times a day (QID) | ORAL | Status: DC | PRN

## 2013-07-16 MED ORDER — RIVAROXABAN 10 MG PO TABS
10.0000 mg | ORAL_TABLET | Freq: Every day | ORAL | Status: DC
  Administered 2013-07-17 – 2013-07-18 (×2): 10 mg via ORAL
  Filled 2013-07-16 (×3): qty 1

## 2013-07-16 MED ORDER — MENTHOL 3 MG MT LOZG: 1.0000 | LOZENGE | OROMUCOSAL | Status: DC | PRN

## 2013-07-16 MED ORDER — SODIUM CHLORIDE 0.9 % IV SOLN
INTRAVENOUS | Status: DC
  Administered 2013-07-16: 14:00:00 via INTRAVENOUS

## 2013-07-16 MED ORDER — SODIUM CHLORIDE 0.9 % IR SOLN
Status: DC | PRN
  Administered 2013-07-16: 1

## 2013-07-16 MED ORDER — KETOROLAC TROMETHAMINE 15 MG/ML IJ SOLN
7.5000 mg | Freq: Four times a day (QID) | INTRAMUSCULAR | Status: AC | PRN
  Administered 2013-07-16: 7.5 mg via INTRAVENOUS
  Filled 2013-07-16: qty 1

## 2013-07-16 MED ORDER — SODIUM CHLORIDE 0.9 % IJ SOLN
INTRAMUSCULAR | Status: AC
  Filled 2013-07-16: qty 50

## 2013-07-16 MED ORDER — PROPOFOL 10 MG/ML IV BOLUS
INTRAVENOUS | Status: AC
Start: 2013-07-16 — End: 2013-07-16
  Filled 2013-07-16: qty 20

## 2013-07-16 MED ORDER — DEXAMETHASONE SODIUM PHOSPHATE 10 MG/ML IJ SOLN
INTRAMUSCULAR | Status: DC | PRN
  Administered 2013-07-16: 10 mg via INTRAVENOUS

## 2013-07-16 MED ORDER — PAROXETINE HCL 10 MG PO TABS
10.0000 mg | ORAL_TABLET | ORAL | Status: DC
  Administered 2013-07-16 – 2013-07-18 (×2): 10 mg via ORAL
  Filled 2013-07-16 (×2): qty 1

## 2013-07-16 MED ORDER — METHOCARBAMOL 500 MG PO TABS
500.0000 mg | ORAL_TABLET | Freq: Four times a day (QID) | ORAL | Status: DC | PRN
  Administered 2013-07-16 – 2013-07-18 (×6): 500 mg via ORAL
  Filled 2013-07-16 (×6): qty 1

## 2013-07-16 MED ORDER — METOCLOPRAMIDE HCL 5 MG/ML IJ SOLN: 5.0000 mg | Freq: Three times a day (TID) | INTRAMUSCULAR | Status: DC | PRN

## 2013-07-16 MED ORDER — LACTATED RINGERS IV SOLN: INTRAVENOUS | Status: DC

## 2013-07-16 MED ORDER — OXYCODONE HCL 5 MG PO TABS
5.0000 mg | ORAL_TABLET | ORAL | Status: DC | PRN
  Administered 2013-07-16 – 2013-07-18 (×12): 10 mg via ORAL
  Filled 2013-07-16 (×12): qty 2

## 2013-07-16 MED ORDER — BUPIVACAINE HCL 0.25 % IJ SOLN
INTRAMUSCULAR | Status: DC | PRN
  Administered 2013-07-16: 30 mL

## 2013-07-16 MED ORDER — BUPIVACAINE HCL (PF) 0.75 % IJ SOLN
INTRAMUSCULAR | Status: DC | PRN
  Administered 2013-07-16: 2 mL

## 2013-07-16 MED ORDER — POLYETHYLENE GLYCOL 3350 17 G PO PACK
17.0000 g | PACK | Freq: Every day | ORAL | Status: DC | PRN
  Administered 2013-07-17 – 2013-07-18 (×2): 17 g via ORAL

## 2013-07-16 MED ORDER — LACTATED RINGERS IV SOLN
INTRAVENOUS | Status: DC
  Administered 2013-07-16 (×2): via INTRAVENOUS
  Administered 2013-07-16: 1000 mL via INTRAVENOUS

## 2013-07-16 MED ORDER — BISACODYL 10 MG RE SUPP: 10.0000 mg | Freq: Every day | RECTAL | Status: DC | PRN

## 2013-07-16 MED ORDER — FENTANYL CITRATE 0.05 MG/ML IJ SOLN
25.0000 ug | INTRAMUSCULAR | Status: DC | PRN
  Administered 2013-07-16 (×3): 50 ug via INTRAVENOUS

## 2013-07-16 MED ORDER — BUPIVACAINE LIPOSOME 1.3 % IJ SUSP
20.0000 mL | Freq: Once | INTRAMUSCULAR | Status: AC
  Administered 2013-07-16: 20 mL
  Filled 2013-07-16: qty 20

## 2013-07-16 MED ORDER — MEPERIDINE HCL 50 MG/ML IJ SOLN: 6.2500 mg | INTRAMUSCULAR | Status: DC | PRN

## 2013-07-16 MED ORDER — HYDROMORPHONE HCL PF 1 MG/ML IJ SOLN
0.2500 mg | INTRAMUSCULAR | Status: DC | PRN
  Administered 2013-07-16 (×4): 0.5 mg via INTRAVENOUS

## 2013-07-16 MED ORDER — ALPRAZOLAM 0.5 MG PO TABS: 0.5000 mg | ORAL_TABLET | Freq: Three times a day (TID) | ORAL | Status: DC | PRN

## 2013-07-16 SURGICAL SUPPLY — 63 items
BAG SPEC THK2 15X12 ZIP CLS (MISCELLANEOUS) ×1
BAG ZIPLOCK 12X15 (MISCELLANEOUS) ×3 IMPLANT
BANDAGE ELASTIC 6 VELCRO ST LF (GAUZE/BANDAGES/DRESSINGS) ×3 IMPLANT
BANDAGE ESMARK 6X9 LF (GAUZE/BANDAGES/DRESSINGS) ×1 IMPLANT
BLADE SAG 18X100X1.27 (BLADE) ×3 IMPLANT
BLADE SAW SGTL 11.0X1.19X90.0M (BLADE) ×3 IMPLANT
BNDG CMPR 9X6 STRL LF SNTH (GAUZE/BANDAGES/DRESSINGS) ×1
BNDG ESMARK 6X9 LF (GAUZE/BANDAGES/DRESSINGS) ×3
BOWL SMART MIX CTS (DISPOSABLE) ×3 IMPLANT
CAP KNEE ATTUNE RP ×2 IMPLANT
CEMENT HV SMART SET (Cement) ×6 IMPLANT
CLOSURE WOUND 1/2 X4 (GAUZE/BANDAGES/DRESSINGS) ×1
CUFF TOURN SGL QUICK 34 (TOURNIQUET CUFF) ×3
CUFF TRNQT CYL 34X4X40X1 (TOURNIQUET CUFF) ×1 IMPLANT
DECANTER SPIKE VIAL GLASS SM (MISCELLANEOUS) ×3 IMPLANT
DRAPE EXTREMITY T 121X128X90 (DRAPE) ×3 IMPLANT
DRAPE POUCH INSTRU U-SHP 10X18 (DRAPES) ×3 IMPLANT
DRAPE U-SHAPE 47X51 STRL (DRAPES) ×3 IMPLANT
DRSG ADAPTIC 3X8 NADH LF (GAUZE/BANDAGES/DRESSINGS) ×3 IMPLANT
DRSG PAD ABDOMINAL 8X10 ST (GAUZE/BANDAGES/DRESSINGS) ×1 IMPLANT
DURAPREP 26ML APPLICATOR (WOUND CARE) ×3 IMPLANT
ELECT REM PT RETURN 9FT ADLT (ELECTROSURGICAL) ×3
ELECTRODE REM PT RTRN 9FT ADLT (ELECTROSURGICAL) ×1 IMPLANT
EVACUATOR 1/8 PVC DRAIN (DRAIN) ×3 IMPLANT
FACESHIELD WRAPAROUND (MASK) ×15 IMPLANT
FACESHIELD WRAPAROUND OR TEAM (MASK) ×5 IMPLANT
GAUZE SPONGE 4X4 12PLY STRL (GAUZE/BANDAGES/DRESSINGS) ×1 IMPLANT
GLOVE BIO SURGEON STRL SZ7.5 (GLOVE) IMPLANT
GLOVE BIO SURGEON STRL SZ8 (GLOVE) ×3 IMPLANT
GLOVE BIOGEL PI IND STRL 6.5 (GLOVE) IMPLANT
GLOVE BIOGEL PI IND STRL 8 (GLOVE) ×1 IMPLANT
GLOVE BIOGEL PI INDICATOR 6.5 (GLOVE)
GLOVE BIOGEL PI INDICATOR 8 (GLOVE) ×2
GLOVE SURG SS PI 6.5 STRL IVOR (GLOVE) IMPLANT
GOWN STRL REUS W/TWL LRG LVL3 (GOWN DISPOSABLE) ×3 IMPLANT
GOWN STRL REUS W/TWL XL LVL3 (GOWN DISPOSABLE) IMPLANT
HANDPIECE INTERPULSE COAX TIP (DISPOSABLE) ×3
IMMOBILIZER KNEE 20 (SOFTGOODS) ×2 IMPLANT
IMMOBILIZER KNEE 20 THIGH 36 (SOFTGOODS) ×1 IMPLANT
KIT BASIN OR (CUSTOM PROCEDURE TRAY) ×3 IMPLANT
MANIFOLD NEPTUNE II (INSTRUMENTS) ×3 IMPLANT
NDL SAFETY ECLIPSE 18X1.5 (NEEDLE) ×2 IMPLANT
NEEDLE HYPO 18GX1.5 SHARP (NEEDLE) ×6
NS IRRIG 1000ML POUR BTL (IV SOLUTION) ×3 IMPLANT
PACK TOTAL JOINT (CUSTOM PROCEDURE TRAY) ×3 IMPLANT
PAD ABD 8X10 STRL (GAUZE/BANDAGES/DRESSINGS) ×2 IMPLANT
PADDING CAST COTTON 6X4 STRL (CAST SUPPLIES) ×4 IMPLANT
POSITIONER SURGICAL ARM (MISCELLANEOUS) ×3 IMPLANT
SET HNDPC FAN SPRY TIP SCT (DISPOSABLE) ×1 IMPLANT
SPONGE GAUZE 4X4 12PLY (GAUZE/BANDAGES/DRESSINGS) ×2 IMPLANT
STRIP CLOSURE SKIN 1/2X4 (GAUZE/BANDAGES/DRESSINGS) ×3 IMPLANT
SUCTION FRAZIER 12FR DISP (SUCTIONS) ×3 IMPLANT
SUT MNCRL AB 4-0 PS2 18 (SUTURE) ×3 IMPLANT
SUT VIC AB 2-0 CT1 27 (SUTURE) ×9
SUT VIC AB 2-0 CT1 TAPERPNT 27 (SUTURE) ×3 IMPLANT
SUT VLOC 180 0 24IN GS25 (SUTURE) ×3 IMPLANT
SYRINGE 20CC LL (MISCELLANEOUS) ×3 IMPLANT
SYRINGE 60CC LL (MISCELLANEOUS) ×3 IMPLANT
TOWEL OR 17X26 10 PK STRL BLUE (TOWEL DISPOSABLE) ×3 IMPLANT
TOWEL OR NON WOVEN STRL DISP B (DISPOSABLE) IMPLANT
TRAY FOLEY CATH 14FRSI W/METER (CATHETERS) ×3 IMPLANT
WATER STERILE IRR 1500ML POUR (IV SOLUTION) ×3 IMPLANT
WRAP KNEE MAXI GEL POST OP (GAUZE/BANDAGES/DRESSINGS) ×3 IMPLANT

## 2013-07-16 NOTE — Transfer of Care (Signed)
Immediate Anesthesia Transfer of Care Note  Patient: Manuel Galloway  Procedure(s) Performed: Procedure(s): RIGHT TOTAL KNEE ARTHROPLASTY (Right)  Patient Location: PACU  Anesthesia Type:MAC  Level of Consciousness: awake and alert   Airway & Oxygen Therapy: Patient Spontanous Breathing and Patient connected to face mask oxygen  Post-op Assessment: Report given to PACU RN and Post -op Vital signs reviewed and stable  Post vital signs: Reviewed and stable  Complications: No apparent anesthesia complications

## 2013-07-16 NOTE — Progress Notes (Deleted)
Orthopedic Tech Progress Note Patient Details:  Manuel Galloway 1965/10/07 [id number] Applied CPM to RLE. CPM Right Knee CPM Right Knee: On Right Knee Flexion (Degrees): 40 Right Knee Extension (Degrees): 10   Darrol Poke 07/16/2013, 2:36 PM

## 2013-07-16 NOTE — Anesthesia Postprocedure Evaluation (Signed)
  Anesthesia Post-op Note  Patient: Manuel Galloway  Procedure(s) Performed: Procedure(s) (LRB): RIGHT TOTAL KNEE ARTHROPLASTY (Right)  Patient Location: PACU  Anesthesia Type: spinal   Level of Consciousness: awake and alert   Airway and Oxygen Therapy: Patient Spontanous Breathing  Post-op Pain: mild  Post-op Assessment: Post-op Vital signs reviewed, Patient's Cardiovascular Status Stable, Respiratory Function Stable, Patent Airway and No signs of Nausea or vomiting  Last Vitals:  Filed Vitals:   07/16/13 1721  BP: 118/66  Pulse: 95  Temp: 36.7 C  Resp: 16    Post-op Vital Signs: stable   Complications: No apparent anesthesia complications

## 2013-07-16 NOTE — Interval H&P Note (Signed)
History and Physical Interval Note:  07/16/2013 8:41 AM  Manuel Galloway  has presented today for surgery, with the diagnosis of OA OF RIGHT KNEE  The various methods of treatment have been discussed with the patient and family. After consideration of risks, benefits and other options for treatment, the patient has consented to  Procedure(s): RIGHT TOTAL KNEE ARTHROPLASTY (Right) as a surgical intervention .  The patient's history has been reviewed, patient examined, no change in status, stable for surgery.  I have reviewed the patient's chart and labs.  Questions were answered to the patient's satisfaction.     Gearlean Alf

## 2013-07-16 NOTE — Anesthesia Procedure Notes (Signed)
Spinal  Patient location during procedure: OR Start time: 07/16/2013 10:40 AM Staffing Anesthesiologist: Montez Hageman CRNA/Resident: Harle Stanford R Performed by: anesthesiologist  Preanesthetic Checklist Completed: patient identified, site marked, surgical consent, pre-op evaluation, timeout performed, IV checked, risks and benefits discussed and monitors and equipment checked Spinal Block Patient position: sitting Prep: Betadine and site prepped and draped Patient monitoring: heart rate, cardiac monitor, continuous pulse ox and blood pressure Approach: left paramedian Location: L2-3 Injection technique: single-shot Needle Needle type: Sprotte  Needle gauge: 24 G Assessment Sensory level: T6

## 2013-07-16 NOTE — Evaluation (Signed)
Physical Therapy Evaluation Patient Details Name: Manuel Galloway MRN: [id number] DOB: 06-24-1965 Today's Date: 07/16/2013   History of Present Illness  48 yo male s/p R TKA 6/29. Hx of L TKA  Clinical Impression  On eval, pt required Min assist for mobility-able to ambulate ~50 feet with walker. Anticipate pt will progress well during stay.     Follow Up Recommendations Home health PT    Equipment Recommendations  None recommended by PT    Recommendations for Other Services       Precautions / Restrictions Precautions Precautions: Knee Restrictions Weight Bearing Restrictions: No      Mobility  Bed Mobility Overal bed mobility: Needs Assistance Bed Mobility: Sit to Supine;Supine to Sit     Supine to sit: Min assist Sit to supine: Min assist   General bed mobility comments: assist for R LE   Transfers Overall transfer level: Needs assistance Equipment used: Rolling walker (2 wheeled) Transfers: Sit to/from Stand           General transfer comment: Assist to rise, stabilize, control descent. VCs safety, techinique, hand placement  Ambulation/Gait Ambulation/Gait assistance: Min guard Ambulation Distance (Feet): 50 Feet Assistive device: Rolling walker (2 wheeled) Gait Pattern/deviations: Step-to pattern;Decreased stride length     General Gait Details: VCs safety, technique, sequence. close guard for safety.   Stairs            Wheelchair Mobility    Modified Rankin (Stroke Patients Only)       Balance                                             Pertinent Vitals/Pain 5/10 R knee with activity. Ice applied end of session    [place] expects to be discharged to:: Private residence Living Arrangements: Spouse/significant other;Children Available Help at Discharge: Family Type of Home: House Home Access: Level entry     Home Layout: Able to live on main level with bedroom/bathroom Home Equipment:  Environmental consultant - 2 wheels      Prior Function Level of Independence: Independent               Hand Dominance   Dominant Hand: Right    Extremity/Trunk Assessment   Upper Extremity Assessment: Overall WFL for tasks assessed           Lower Extremity Assessment: RLE deficits/detail RLE Deficits / Details: hip flex 2/5, hip abd/add 2/5, moves ankle well    Cervical / Trunk Assessment: Normal  Communication   Communication: No difficulties  Cognition Arousal/Alertness: Awake/alert Behavior During Therapy: WFL for tasks assessed/performed Overall Cognitive Status: Within Functional Limits for tasks assessed                      General Comments      Exercises        Assessment/Plan    PT Assessment Patient needs continued PT services  PT Diagnosis Difficulty walking;Abnormality of gait;Acute pain   PT Problem List Decreased strength;Decreased range of motion;Decreased activity tolerance;Decreased balance;Decreased mobility;Pain;Decreased knowledge of use of DME;Decreased knowledge of precautions  PT Treatment Interventions DME instruction;Gait training;Functional mobility training;Therapeutic activities;Therapeutic exercise;Patient/family education;Balance training   PT Goals (Current goals can be found in the Care Plan section) Acute Rehab PT Goals Patient Stated Goal: home soon PT Goal Formulation: With patient Time For Goal Achievement: 07/23/13 Potential  to Achieve Goals: Good    Frequency 7X/week   Barriers to discharge        Co-evaluation               End of Session Equipment Utilized During Treatment: Gait belt Activity Tolerance: Patient tolerated treatment well Patient left: in bed;with call bell/phone within reach;with family/visitor present           Time: 7628-3151 PT Time Calculation (min): 22 min   Charges:   PT Evaluation $Initial PT Evaluation Tier I: 1 Procedure PT Treatments $Gait Training: 8-22 mins   PT G  Codes:          Weston Anna, MPT Pager: [phone]

## 2013-07-16 NOTE — Op Note (Signed)
Pre-operative diagnosis- Osteoarthritis  Right knee(s)  Post-operative diagnosis- Osteoarthritis Right knee(s)  Procedure-  Right  Total Knee Arthroplasty (Attune system)  Surgeon- Dione Plover. Aluisio, MD  Assistant- Arlee Muslim, PA-C   Anesthesia-  Spinal  EBL-* No blood loss amount entered *   Drains Hemovac  Tourniquet time-  Total Tourniquet Time Documented: Thigh (Right) - 40 minutes Total: Thigh (Right) - 40 minutes     Complications- None  Condition-PACU - hemodynamically stable.   Brief Clinical Note  Manuel Galloway is a 48 y.o. year old male with end stage OA of his right knee with progressively worsening pain and dysfunction. He has constant pain, with activity and at rest and significant functional deficits with difficulties even with ADLs. He has had extensive non-op management including analgesics, injections of cortisone, and home exercise program, but remains in significant pain with significant dysfunction. He presents now for right Total Knee Arthroplasty.    Procedure in detail---   The patient is brought into the operating room and positioned supine on the operating table. After successful administration of  Spinal,   a tourniquet is placed high on the  Right thigh(s) and the lower extremity is prepped and draped in the usual sterile fashion. Time out is performed by the operating team and then the  Right lower extremity is wrapped in Esmarch, knee flexed and the tourniquet inflated to 300 mmHg.       A midline incision is made with a ten blade through the subcutaneous tissue to the level of the extensor mechanism. A fresh blade is used to make a medial parapatellar arthrotomy. Soft tissue over the proximal medial tibia is subperiosteally elevated to the joint line with a knife and into the semimembranosus bursa with a Cobb elevator. Soft tissue over the proximal lateral tibia is elevated with attention being paid to avoiding the patellar tendon on the tibial tubercle.  The patella is everted, knee flexed 90 degrees and the ACL and PCL are removed. Findings are bone on bone medial and patellofemoral.        The drill is used to create a starting hole in the distal femur and the canal is thoroughly irrigated with sterile saline to remove the fatty contents. The 5 degree Right  valgus alignment guide is placed into the femoral canal and the distal femoral cutting block is pinned to remove 10 mm off the distal femur. Resection is made with an oscillating saw.      The tibia is subluxed forward and the menisci are removed. The extramedullary alignment guide is placed referencing proximally at the medial aspect of the tibial tubercle and distally along the second metatarsal axis and tibial crest. The block is pinned to remove 38mm off the more deficient medial  side. Resection is made with an oscillating saw. Size 8is the most appropriate size for the tibia and the proximal tibia is prepared with the modular drill and keel punch for that size.      The femoral sizing guide is placed and size 8 is most appropriate. Rotation is marked off the epicondylar axis and confirmed by creating a rectangular flexion gap at 90 degrees. The size 8 cutting block is pinned in this rotation and the anterior, posterior and chamfer cuts are made with the oscillating saw. The intercondylar block is then placed and that cut is made.      Trial size 8 tibial component, trial size 8 posterior stabilized femur and a 6  mm posterior stabilized  rotating platform insert trial is placed. Full extension is achieved with excellent varus/valgus and anterior/posterior balance throughout full range of motion. The patella is everted and thickness measured to be 27  mm. Free hand resection is taken to 15 mm, a 41 template is placed, lug holes are drilled, trial patella is placed, and it tracks normally. Osteophytes are removed off the posterior femur with the trial in place. All trials are removed and the cut bone  surfaces prepared with pulsatile lavage. Cement is mixed and once ready for implantation, the size 8 tibial implant, size  8 posterior stabilized femoral component, and the size 41 patella are cemented in place and the patella is held with the clamp. The trial insert is placed and the knee held in full extension. The Exparel (20 ml mixed with 30 ml saline) and .25% Bupivicaine, are injected into the extensor mechanism, posterior capsule, medial and lateral gutters and subcutaneous tissues.  All extruded cement is removed and once the cement is hard the permanent 6 mm posterior stabilized rotating platform insert is placed into the tibial tray.      The wound is copiously irrigated with saline solution and the extensor mechanism closed over a hemovac drain with #1 V-loc suture. The tourniquet is released for a total tourniquet time of 40  minutes. Flexion against gravity is 140 degrees and the patella tracks normally. Subcutaneous tissue is closed with 2.0 vicryl and subcuticular with running 4.0 Monocryl. The incision is cleaned and dried and steri-strips and a bulky sterile dressing are applied. The limb is placed into a knee immobilizer and the patient is awakened and transported to recovery in stable condition.      Please note that a surgical assistant was a medical necessity for this procedure in order to perform it in a safe and expeditious manner. Surgical assistant was necessary to retract the ligaments and vital neurovascular structures to prevent injury to them and also necessary for proper positioning of the limb to allow for anatomic placement of the prosthesis.   Dione Plover Aluisio, MD    07/16/2013, 11:49 AM

## 2013-07-16 NOTE — Anesthesia Preprocedure Evaluation (Signed)
Anesthesia Evaluation  Patient identified by MRN, date of birth, ID band Patient awake    Reviewed: Allergy & Precautions, H&P , NPO status , Patient's Chart, lab work & pertinent test results, reviewed documented beta blocker date and time   Airway Mallampati: I TM Distance: >3 FB Neck ROM: Full    Dental  (+) Teeth Intact, Dental Advisory Given   Pulmonary neg pulmonary ROS, former smoker,  breath sounds clear to auscultation        Cardiovascular negative cardio ROS  Rhythm:Regular Rate:Normal  Denies cardiac symptoms   Neuro/Psych negative neurological ROS  negative psych ROS   GI/Hepatic negative GI ROS, Neg liver ROS,   Endo/Other  negative endocrine ROS  Renal/GU negative Renal ROS  negative genitourinary   Musculoskeletal   Abdominal   Peds negative pediatric ROS (+)  Hematology negative hematology ROS (+)   Anesthesia Other Findings   Reproductive/Obstetrics negative OB ROS                           Anesthesia Physical  Anesthesia Plan  ASA: I  Anesthesia Plan: Spinal   Post-op Pain Management:    Induction:   Airway Management Planned:   Additional Equipment:   Intra-op Plan:   Post-operative Plan:   Informed Consent: I have reviewed the patients History and Physical, chart, labs and discussed the procedure including the risks, benefits and alternatives for the proposed anesthesia with the patient or authorized representative who has indicated his/her understanding and acceptance.   Dental advisory given  Plan Discussed with: CRNA and Surgeon  Anesthesia Plan Comments:         Anesthesia Quick Evaluation

## 2013-07-17 ENCOUNTER — Encounter (HOSPITAL_COMMUNITY): Payer: Self-pay | Admitting: *Deleted

## 2013-07-17 DIAGNOSIS — D62 Acute posthemorrhagic anemia: Secondary | ICD-10-CM | POA: Diagnosis not present

## 2013-07-17 LAB — BASIC METABOLIC PANEL
BUN: 12 mg/dL (ref 6–23)
CALCIUM: 8.5 mg/dL (ref 8.4–10.5)
CO2: 25 meq/L (ref 19–32)
CREATININE: 0.76 mg/dL (ref 0.50–1.35)
Chloride: 100 mEq/L (ref 96–112)
GFR calc Af Amer: >90 mL/min (ref >90)
GFR calc non Af Amer: >90 mL/min (ref >90)
Glucose, Bld: 127 mg/dL — ABNORMAL HIGH (ref 70–99)
Potassium: 4.1 mEq/L (ref 3.7–5.3)
Sodium: 137 mEq/L (ref 137–147)

## 2013-07-17 LAB — CBC
HCT: 36.4 % — ABNORMAL LOW (ref 39.0–52.0)
Hemoglobin: 12.6 g/dL — ABNORMAL LOW (ref 13.0–17.0)
MCH: 30.2 pg (ref 26.0–34.0)
MCHC: 34.6 g/dL (ref 30.0–36.0)
MCV: 87.3 fL (ref 78.0–100.0)
Platelets: 167 10*3/uL (ref 150–400)
RBC: 4.17 MIL/uL — ABNORMAL LOW (ref 4.22–5.81)
RDW: 12.5 % (ref 11.5–15.5)
WBC: 17 10*3/uL — ABNORMAL HIGH (ref 4.0–10.5)

## 2013-07-17 MED ORDER — HYDROMORPHONE HCL PF 1 MG/ML IJ SOLN
0.5000 mg | INTRAMUSCULAR | Status: DC | PRN
  Administered 2013-07-17 – 2013-07-18 (×3): 1 mg via INTRAVENOUS
  Filled 2013-07-17 (×3): qty 1

## 2013-07-17 NOTE — Progress Notes (Signed)
Physical Therapy Treatment Patient Details Name: Manuel Galloway MRN: [id number] DOB: 1965-03-02 Today's Date: 07/17/2013    History of Present Illness 48 yo male s/p R TKA 6/29. Hx of L TKA    PT Comments    Progressing well with mobility. Unable to SLR this session so left KI in place.   Follow Up Recommendations  Home health PT     Equipment Recommendations  None recommended by PT    Recommendations for Other Services       Precautions / Restrictions Precautions Precautions: Knee Required Braces or Orthoses: Knee Immobilizer - Right Knee Immobilizer - Right: Discontinue once straight leg raise with < 10 degree lag Restrictions Weight Bearing Restrictions: No RLE Weight Bearing: Weight bearing as tolerated    Mobility  Bed Mobility Overal bed mobility: Needs Assistance Bed Mobility: Supine to Sit     Supine to sit: Min assist     General bed mobility comments: assist for R LE   Transfers Overall transfer level: Needs assistance Equipment used: Rolling walker (2 wheeled) Transfers: Sit to/from Stand Sit to Stand: Min guard;From elevated surface         General transfer comment: VCs safety, techinique, hand placement. close guard for safety  Ambulation/Gait Ambulation/Gait assistance: Min guard Ambulation Distance (Feet): 135 Feet Assistive device: Rolling walker (2 wheeled) Gait Pattern/deviations: Step-to pattern;Decreased stride length     General Gait Details: VCs safety, technique, sequence. close guard for safety.    Stairs            Wheelchair Mobility    Modified Rankin (Stroke Patients Only)       Balance                                    Cognition Arousal/Alertness: Awake/alert Behavior During Therapy: WFL for tasks assessed/performed Overall Cognitive Status: Within Functional Limits for tasks assessed                      Exercises      General Comments        Pertinent Vitals/Pain 5/10 R  knee with activity.     Home Living                      Prior Function            PT Goals (current goals can now be found in the care plan section) Progress towards PT goals: Progressing toward goals    Frequency  7X/week    PT Plan Current plan remains appropriate    Co-evaluation             End of Session Equipment Utilized During Treatment: Gait belt Activity Tolerance: Patient tolerated treatment well Patient left: with call bell/phone within reach (in bathroom; made nurse techn aware of need for assistance once he's ready)     Time: 1030-1050 PT Time Calculation (min): 20 min  Charges:  $Gait Training: 8-22 mins                    G Codes:      Weston Anna, MPT Pager: [phone]

## 2013-07-17 NOTE — Progress Notes (Signed)
   Subjective: 1 Day Post-Op Procedure(s) (LRB): RIGHT TOTAL KNEE ARTHROPLASTY (Right) Patient reports pain as mild.   Patient seen in rounds with Dr. Wynelle Link. Patient is well, and has had no acute complaints or problems We will start therapy today. Already able to do SLR's. Plan is to go Home after hospital stay.  Objective: Vital signs in last 24 hours: Temp:  [97.5 F (36.4 C)-98.2 F (36.8 C)] 98.2 F (36.8 C) (06/30 0500) Pulse Rate:  [57-101] 75 (06/30 0500) Resp:  [12-97] 97 (06/30 0744) BP: (107-139)/(62-88) 110/63 mmHg (06/30 0500) SpO2:  [92 %-100 %] 96 % (06/30 0500) Weight:  [110.678 kg (244 lb)] 110.678 kg (244 lb) (06/29 1414)  Intake/Output from previous day:  Intake/Output Summary (Last 24 hours) at 07/17/13 0806 Last data filed at 07/17/13 0700  Gross per 24 hour  Intake 4191.67 ml  Output   3195 ml  Net 996.67 ml    Intake/Output this shift:    Labs:  Recent Labs  07/17/13 0522  HGB 12.6*    Recent Labs  07/17/13 0522  WBC 17.0*  RBC 4.17*  HCT 36.4*  PLT 167    Recent Labs  07/17/13 0522  NA 137  K 4.1  CL 100  CO2 25  BUN 12  CREATININE 0.76  GLUCOSE 127*  CALCIUM 8.5   No results found for this basename: LABPT, INR,  in the last 72 hours  EXAM General - Patient is Alert, Appropriate and Oriented Extremity - Neurovascular intact Sensation intact distally Dorsiflexion/Plantar flexion intact Dressing - dressing C/D/I Motor Function - intact, moving foot and toes well on exam.  Hemovac pulled without difficulty.  Past Medical History  Diagnosis Date  . Arthritis   . Anxiety   . Depression     Assessment/Plan: 1 Day Post-Op Procedure(s) (LRB): RIGHT TOTAL KNEE ARTHROPLASTY (Right) Active Problems:   OA (osteoarthritis) of knee   Postoperative anemia due to acute blood loss  Estimated body mass index is 30.5 kg/(m^2) as calculated from the following:   Height as of this encounter: 6\' 3"  (1.905 m).   Weight as of  this encounter: 110.678 kg (244 lb). Advance diet Up with therapy Plan for discharge tomorrow Discharge home with home health  DVT Prophylaxis - Xarelto Weight-Bearing as tolerated to right leg D/C O2 and Pulse OX and try on Room Air  Arlee Muslim, PA-C Orthopaedic Surgery 07/17/2013, 8:06 AM

## 2013-07-17 NOTE — Discharge Instructions (Addendum)
° °Dr. Frank Aluisio °Total Joint Specialist °[facility] °[address] °[place], [place] [address] °[phone] ° °TOTAL KNEE REPLACEMENT POSTOPERATIVE DIRECTIONS ° ° ° °Knee Rehabilitation, Guidelines Following Surgery  °Results after knee surgery are often greatly improved when you follow the exercise, range of motion and muscle strengthening exercises prescribed by your doctor. Safety measures are also important to protect the knee from further injury. Any time any of these exercises cause you to have increased pain or swelling in your knee joint, decrease the amount until you are comfortable again and slowly increase them. If you have problems or questions, call your caregiver or physical therapist for advice.  ° °HOME CARE INSTRUCTIONS  °Remove items at home which could result in a fall. This includes throw rugs or furniture in walking pathways.  °Continue medications as instructed at time of discharge. °You may have some home medications which will be placed on hold until you complete the course of blood thinner medication.  °You may start showering once you are discharged home but do not submerge the incision under water. Just pat the incision dry and apply a dry gauze dressing on daily. °Walk with walker as instructed.  °You may resume a sexual relationship in one month or when given the OK by  your doctor.  °· Use walker as long as suggested by your caregivers. °· Avoid periods of inactivity such as sitting longer than an hour when not asleep. This helps prevent blood clots.  °You may put full weight on your legs and walk as much as is comfortable.  °You may return to work once you are cleared by your doctor.  °Do not drive a car for 6 weeks or until released by you surgeon.  °· Do not drive while taking narcotics.  °Wear the elastic stockings for three weeks following surgery during the day but you may remove then at night. °Make sure you keep all of your appointments after your  operation with all of your doctors and caregivers. You should call the office at the above phone number and make an appointment for approximately two weeks after the date of your surgery. °Change the dressing daily and reapply a dry dressing each time. °Please pick up a stool softener and laxative for home use as long as you are requiring pain medications. °· Continue to use ice on the knee for pain and swelling from surgery. You may notice swelling that will progress down to the foot and ankle.  This is normal after surgery.  Elevate the leg when you are not up walking on it.   °It is important for you to complete the blood thinner medication as prescribed by your doctor. °· Continue to use the breathing machine which will help keep your temperature down.  It is common for your temperature to cycle up and down following surgery, especially at night when you are not up moving around and exerting yourself.  The breathing machine keeps your lungs expanded and your temperature down. ° °RANGE OF MOTION AND STRENGTHENING EXERCISES  °Rehabilitation of the knee is important following a knee injury or an operation. After just a few days of immobilization, the muscles of the thigh which control the knee become weakened and shrink (atrophy). Knee exercises are designed to build up the tone and strength of the thigh muscles and to improve knee motion. Often times heat used for twenty to thirty minutes before working out will loosen up your tissues and help with improving the   range of motion but do not use heat for the first two weeks following surgery. These exercises can be done on a training (exercise) mat, on the floor, on a table or on a bed. Use what ever works the best and is most comfortable for you Knee exercises include:  Leg Lifts - While your knee is still immobilized in a splint or cast, you can do straight leg raises. Lift the leg to 60 degrees, hold for 3 sec, and slowly lower the leg. Repeat 10-20 times 2-3  times daily. Perform this exercise against resistance later as your knee gets better.  Quad and Hamstring Sets - Tighten up the muscle on the front of the thigh (Quad) and hold for 5-10 sec. Repeat this 10-20 times hourly. Hamstring sets are done by pushing the foot backward against an object and holding for 5-10 sec. Repeat as with quad sets.  A rehabilitation program following serious knee injuries can speed recovery and prevent re-injury in the future due to weakened muscles. Contact your doctor or a physical therapist for more information on knee rehabilitation.   SKILLED REHAB INSTRUCTIONS: If the patient is transferred to a skilled rehab facility following release from the hospital, a list of the current medications will be sent to the facility for the patient to continue.  When discharged from the skilled rehab facility, please have the facility set up the patient's [place] prior to being released. Also, the skilled facility will be responsible for providing the patient with their medications at time of release from the facility to include their pain medication, the muscle relaxants, and their blood thinner medication. If the patient is still at the rehab facility at time of the two week follow up appointment, the skilled rehab facility will also need to assist the patient in arranging follow up appointment in our office and any transportation needs.  MAKE SURE YOU:  Understand these instructions.  Will watch your condition.  Will get help right away if you are not doing well or get worse.    Pick up stool softner and laxative for home. Do not submerge incision under water. May shower. Continue to use ice for pain and swelling from surgery.  Take Xarelto for two and a half more weeks, then discontinue Xarelto. Once the patient has completed the blood thinner regimen, then take a Baby 81 mg Aspirin daily for three more weeks.   Information on my medicine - XARELTO  (Rivaroxaban)  This medication education was reviewed with me or my healthcare representative as part of my discharge preparation.  The pharmacist that spoke with me during my hospital stay was:  Emiliano Dyer, RPH  Why was Xarelto prescribed for you? Xarelto was prescribed for you to reduce the risk of blood clots forming after orthopedic surgery. The medical term for these abnormal blood clots is venous thromboembolism (VTE).  What do you need to know about xarelto ? Take your Xarelto ONCE DAILY at the same time every day. You may take it either with or without food.  If you have difficulty swallowing the tablet whole, you may crush it and mix in applesauce just prior to taking your dose.  Take Xarelto exactly as prescribed by your doctor and DO NOT stop taking Xarelto without talking to the doctor who prescribed the medication.  Stopping without other VTE prevention medication to take the place of Xarelto may increase your risk of developing a clot.  After discharge, you should have regular check-up  appointments with your healthcare provider that is prescribing your Xarelto.    What do you do if you miss a dose? If you miss a dose, take it as soon as you remember on the same day then continue your regularly scheduled once daily regimen the next day. Do not take two doses of Xarelto on the same day.   Important Safety Information A possible side effect of Xarelto is bleeding. You should call your healthcare provider right away if you experience any of the following:   Bleeding from an injury or your nose that does not stop.   Unusual colored urine (red or dark brown) or unusual colored stools (red or black).   Unusual bruising for unknown reasons.   A serious fall or if you hit your head (even if there is no bleeding).  Some medicines may interact with Xarelto and might increase your risk of bleeding while on Xarelto. To help avoid this, consult your healthcare provider  or pharmacist prior to using any new prescription or non-prescription medications, including herbals, vitamins, non-steroidal anti-inflammatory drugs (NSAIDs) and supplements.  This website has more information on Xarelto: [email or web address]

## 2013-07-17 NOTE — Progress Notes (Signed)
Physical Therapy Treatment Patient Details Name: Manuel Galloway MRN: [id number] DOB: Nov 07, 1965 Today's Date: 07/17/2013    History of Present Illness 48 yo male s/p R TKA 6/29. Hx of L TKA    PT Comments    Progressing with mobility. Pain level is still moderate.   Follow Up Recommendations  Home health PT     Equipment Recommendations  None recommended by PT    Recommendations for Other Services       Precautions / Restrictions Precautions Precautions: Knee Required Braces or Orthoses: Knee Immobilizer - Right Knee Immobilizer - Right: Discontinue once straight leg raise with < 10 degree lag Restrictions Weight Bearing Restrictions: No RLE Weight Bearing: Weight bearing as tolerated    Mobility  Bed Mobility Overal bed mobility: Needs Assistance Bed Mobility: Supine to Sit;Sit to Supine     Supine to sit: Min guard Sit to supine: Min guard   General bed mobility comments: Pt uses opposite foot to hook R LE off/onto bed.   Transfers Overall transfer level: Needs assistance Equipment used: Rolling walker (2 wheeled) Transfers: Sit to/from Stand Sit to Stand: From elevated surface;Min guard         General transfer comment: VCs safety, techinique, hand placement. close guard for safety  Ambulation/Gait Ambulation/Gait assistance: Min guard Ambulation Distance (Feet): 140 Feet Assistive device: Rolling walker (2 wheeled) Gait Pattern/deviations: Step-to pattern;Antalgic;Trunk flexed     General Gait Details: VCs safety, technique, sequence. close guard for safety.    Stairs            Wheelchair Mobility    Modified Rankin (Stroke Patients Only)       Balance                                    Cognition Arousal/Alertness: Awake/alert Behavior During Therapy: WFL for tasks assessed/performed Overall Cognitive Status: Within Functional Limits for tasks assessed                      Exercises Total Joint  Exercises Ankle Circles/Pumps: AROM;Both;10 reps;Supine Quad Sets: AROM;Both;10 reps;Supine Heel Slides: AAROM;Right;10 reps;Supine Hip ABduction/ADduction: AAROM;Right;10 reps;Supine Straight Leg Raises: AAROM;Right;10 reps;Supine Goniometric ROM: 10-45 degrees    General Comments        Pertinent Vitals/Pain 7/10 R knee with activity. Ice applied end of session    Home Living                      Prior Function            PT Goals (current goals can now be found in the care plan section) Progress towards PT goals: Progressing toward goals    Frequency  7X/week    PT Plan Current plan remains appropriate    Co-evaluation             End of Session   Activity Tolerance: Patient limited by pain Patient left: in bed;with call bell/phone within reach;with family/visitor present     Time: 5809-9833 PT Time Calculation (min): 28 min  Charges:  $Gait Training: 8-22 mins $Therapeutic Exercise: 8-22 mins                    G Codes:      Weston Anna, MPT Pager: [phone]

## 2013-07-17 NOTE — Progress Notes (Signed)
OT Cancellation Note  Patient Details Name: SHERMAINE BRIGHAM MRN: [id number] DOB: 12-27-1965   Cancelled Treatment:    Reason Eval/Treat Not Completed: OT screened, no needs identified, will sign off  SPENCER,MARYELLEN 07/17/2013, 7:58 AM Lesle Chris, OTR/L [phone] 07/17/2013

## 2013-07-18 LAB — CBC
HCT: 34.3 % — ABNORMAL LOW (ref 39.0–52.0)
Hemoglobin: 11.7 g/dL — ABNORMAL LOW (ref 13.0–17.0)
MCH: 30 pg (ref 26.0–34.0)
MCHC: 34.1 g/dL (ref 30.0–36.0)
MCV: 87.9 fL (ref 78.0–100.0)
PLATELETS: 185 10*3/uL (ref 150–400)
RBC: 3.9 MIL/uL — ABNORMAL LOW (ref 4.22–5.81)
RDW: 13 % (ref 11.5–15.5)
WBC: 17.2 10*3/uL — AB (ref 4.0–10.5)

## 2013-07-18 LAB — BASIC METABOLIC PANEL
BUN: 12 mg/dL (ref 6–23)
CALCIUM: 8.6 mg/dL (ref 8.4–10.5)
CO2: 26 mEq/L (ref 19–32)
CREATININE: 0.7 mg/dL (ref 0.50–1.35)
Chloride: 100 mEq/L (ref 96–112)
Glucose, Bld: 117 mg/dL — ABNORMAL HIGH (ref 70–99)
Potassium: 3.7 mEq/L (ref 3.7–5.3)
Sodium: 138 mEq/L (ref 137–147)

## 2013-07-18 MED ORDER — RIVAROXABAN 10 MG PO TABS: 10.0000 mg | ORAL_TABLET | Freq: Every day | ORAL | Status: DC

## 2013-07-18 MED ORDER — METHOCARBAMOL 500 MG PO TABS: 500.0000 mg | ORAL_TABLET | Freq: Four times a day (QID) | ORAL | Status: DC | PRN

## 2013-07-18 MED ORDER — OXYCODONE HCL 5 MG PO TABS: 5.0000 mg | ORAL_TABLET | ORAL | Status: DC | PRN

## 2013-07-18 NOTE — Progress Notes (Signed)
Physical Therapy Treatment Patient Details Name: ELIAV MECHLING MRN: [id number] DOB: [birth year] Today's Date: 07/18/2013    History of Present Illness 48 yo male s/p R TKA 6/29. Hx of L TKA    PT Comments    Pt reports pain as moderate. Practiced ambulation, steps(at pt's request),exercises. All education completed. REady to d/c from PT standpoint.   Follow Up Recommendations  Home health PT     Equipment Recommendations  None recommended by PT    Recommendations for Other Services       Precautions / Restrictions Precautions Precautions: Knee Required Braces or Orthoses: Knee Immobilizer - Right Knee Immobilizer - Right: Discontinue once straight leg raise with < 10 degree lag Restrictions Weight Bearing Restrictions: No RLE Weight Bearing: Weight bearing as tolerated    Mobility  Bed Mobility         Supine to sit: Min guard Sit to supine: Min guard   General bed mobility comments: Pt uses opposite foot to hook R LE off/onto bed.   Transfers Overall transfer level: Needs assistance Equipment used: Rolling walker (2 wheeled) Transfers: Sit to/from Stand Sit to Stand: Min guard         General transfer comment: VCs safety, techinique, hand placement. close guard for safety  Ambulation/Gait   Ambulation Distance (Feet): 150 Feet Assistive device: Rolling walker (2 wheeled) Gait Pattern/deviations: Step-to pattern;Antalgic     General Gait Details: VCs safety, technique, sequence. close guard for safety.    Stairs Stairs: Yes Stairs assistance: Min guard Stair Management: Forwards;Step to pattern;Two rails Number of Stairs: 5 General stair comments: up and over portable steps x 2. VCs safety, technique, sequence. Min guard for Education officer, community    Modified Rankin (Stroke Patients Only)       Balance                                    Cognition Arousal/Alertness: Awake/alert   Overall Cognitive Status: Within  Functional Limits for tasks assessed                      Exercises Total Joint Exercises Ankle Circles/Pumps: AROM;Both;10 reps;Supine Quad Sets: AROM;Both;10 reps;Supine Hip ABduction/ADduction: AAROM;Right;10 reps;Supine Straight Leg Raises: AAROM;Right;10 reps;Supine Knee Flexion: AAROM;Right;5 reps;Seated Goniometric ROM: 10-50 degrees    General Comments        Pertinent Vitals/Pain 6/10 R knee with activity. Ice applied end of session    Home Living                      Prior Function            PT Goals (current goals can now be found in the care plan section) Progress towards PT goals: Progressing toward goals    Frequency  7X/week    PT Plan Current plan remains appropriate    Co-evaluation             End of Session Equipment Utilized During Treatment: Gait belt Activity Tolerance: Patient tolerated treatment well Patient left: in bed;with call bell/phone within reach     Time: 1009-1033 PT Time Calculation (min): 24 min  Charges:  $Gait Training: 8-22 mins $Therapeutic Exercise: 8-22 mins                    G Codes:      Weston Anna, MPT Pager: [phone]

## 2013-07-18 NOTE — Progress Notes (Signed)
   Subjective: 2 Days Post-Op Procedure(s) (LRB): RIGHT TOTAL KNEE ARTHROPLASTY (Right) Patient reports pain as mild.   Patient seen in rounds with Dr. Wynelle Link. Patient is well, and has had no acute complaints or problems Patient is ready to go home  Objective: Vital signs in last 24 hours: Temp:  [97.8 F (36.6 C)-98.9 F (37.2 C)] 98.8 F (37.1 C) (07/01 0554) Pulse Rate:  [59-102] 75 (07/01 0554) Resp:  [16-20] 20 (07/01 0554) BP: (109-155)/(64-84) 121/77 mmHg (07/01 0554) SpO2:  [94 %-97 %] 94 % (07/01 0554)  Intake/Output from previous day:  Intake/Output Summary (Last 24 hours) at 07/18/13 0859 Last data filed at 07/18/13 0400  Gross per 24 hour  Intake   1812 ml  Output   2800 ml  Net   -988 ml    Intake/Output this shift:    Labs:  Recent Labs  07/17/13 0522 07/18/13 0448  HGB 12.6* 11.7*    Recent Labs  07/17/13 0522 07/18/13 0448  WBC 17.0* 17.2*  RBC 4.17* 3.90*  HCT 36.4* 34.3*  PLT 167 185    Recent Labs  07/17/13 0522 07/18/13 0448  NA 137 138  K 4.1 3.7  CL 100 100  CO2 25 26  BUN 12 12  CREATININE 0.76 0.70  GLUCOSE 127* 117*  CALCIUM 8.5 8.6   No results found for this basename: LABPT, INR,  in the last 72 hours  EXAM: General - Patient is Alert, Appropriate and Oriented Extremity - Neurovascular intact Sensation intact distally Dorsiflexion/Plantar flexion intact Incision - clean, dry, no drainage Motor Function - intact, moving foot and toes well on exam.   Assessment/Plan: 2 Days Post-Op Procedure(s) (LRB): RIGHT TOTAL KNEE ARTHROPLASTY (Right) Procedure(s) (LRB): RIGHT TOTAL KNEE ARTHROPLASTY (Right) Past Medical History  Diagnosis Date  . Arthritis   . Anxiety   . Depression    Active Problems:   OA (osteoarthritis) of knee   Postoperative anemia due to acute blood loss  Estimated body mass index is 30.5 kg/(m^2) as calculated from the following:   Height as of this encounter: 6\' 3"  (1.905 m).   Weight as  of this encounter: 110.678 kg (244 lb). Up with therapy Discharge home with home health Diet - Regular diet Follow up - in 2 weeks Activity - WBAT Disposition - Home Condition Upon Discharge - Good D/C Meds - See DC Summary DVT Prophylaxis - Xarelto  Arlee Muslim, PA-C Orthopaedic Surgery 07/18/2013, 8:59 AM

## 2013-07-18 NOTE — Discharge Summary (Signed)
Physician Discharge Summary   Patient ID: Manuel Galloway MRN: [id number] DOB/AGE: [birth year] 48 y.o.  Admit date: 07/16/2013 Discharge date: 07-18-2013  Primary Diagnosis:  Osteoarthritis Right knee(s)  Admission Diagnoses:  Past Medical History  Diagnosis Date  . Arthritis   . Anxiety   . Depression    Discharge Diagnoses:   Active Problems:   OA (osteoarthritis) of knee   Postoperative anemia due to acute blood loss  Estimated body mass index is 30.5 kg/(m^2) as calculated from the following:   Height as of this encounter: _0  (1.905 m).   Weight as of this encounter: 110.678 kg (244 lb).  Procedure:  Procedure(s) (LRB): RIGHT TOTAL KNEE ARTHROPLASTY (Right)   Consults: None  HPI: Manuel Galloway is a 48 y.o. year old male with end stage OA of his right knee with progressively worsening pain and dysfunction. He has constant pain, with activity and at rest and significant functional deficits with difficulties even with ADLs. He has had extensive non-op management including analgesics, injections of cortisone, and home exercise program, but remains in significant pain with significant dysfunction. He presents now for right Total Knee Arthroplasty  Laboratory Data: Admission on 07/16/2013, Discharged on 07/18/2013  Component Date Value Ref Range Status  . ABO/RH(D) 07/16/2013 A POS   Final  . Antibody Screen 07/16/2013 NEG   Final  . Sample Expiration 07/16/2013 07/19/2013   Final  . WBC 07/17/2013 17.0* 4.0 - 10.5 K/uL Final  . RBC 07/17/2013 4.17* 4.22 - 5.81 MIL/uL Final  . Hemoglobin 07/17/2013 12.6* 13.0 - 17.0 g/dL Final  . HCT 07/17/2013 36.4* 39.0 - 52.0 % Final  . MCV 07/17/2013 87.3  78.0 - 100.0 fL Final  . MCH 07/17/2013 30.2  26.0 - 34.0 pg Final  . MCHC 07/17/2013 34.6  30.0 - 36.0 g/dL Final  . RDW 07/17/2013 12.5  11.5 - 15.5 % Final  . Platelets 07/17/2013 167  150 - 400 K/uL Final  . Sodium 07/17/2013 137  137 - 147 mEq/L Final  . Potassium 07/17/2013 4.1   3.7 - 5.3 mEq/L Final  . Chloride 07/17/2013 100  96 - 112 mEq/L Final  . CO2 07/17/2013 25  19 - 32 mEq/L Final  . Glucose, Bld 07/17/2013 127* 70 - 99 mg/dL Final  . BUN 07/17/2013 12  6 - 23 mg/dL Final  . Creatinine, Ser 07/17/2013 0.76  0.50 - 1.35 mg/dL Final  . Calcium 07/17/2013 8.5  8.4 - 10.5 mg/dL Final  . GFR calc non Af Amer 07/17/2013 >90  >90 mL/min Final  . GFR calc Af Amer 07/17/2013 >90  >90 mL/min Final   Comment: (NOTE)                          The eGFR has been calculated using the CKD EPI equation.                          This calculation has not been validated in all clinical situations.                          eGFR's persistently <90 mL/min signify possible Chronic Kidney                          Disease.  . WBC 07/18/2013 17.2* 4.0 - 10.5 K/uL Final  . RBC 07/18/2013 3.90*  4.22 - 5.81 MIL/uL Final  . Hemoglobin 07/18/2013 11.7* 13.0 - 17.0 g/dL Final  . HCT 07/18/2013 34.3* 39.0 - 52.0 % Final  . MCV 07/18/2013 87.9  78.0 - 100.0 fL Final  . MCH 07/18/2013 30.0  26.0 - 34.0 pg Final  . MCHC 07/18/2013 34.1  30.0 - 36.0 g/dL Final  . RDW 07/18/2013 13.0  11.5 - 15.5 % Final  . Platelets 07/18/2013 185  150 - 400 K/uL Final  . Sodium 07/18/2013 138  137 - 147 mEq/L Final  . Potassium 07/18/2013 3.7  3.7 - 5.3 mEq/L Final  . Chloride 07/18/2013 100  96 - 112 mEq/L Final  . CO2 07/18/2013 26  19 - 32 mEq/L Final  . Glucose, Bld 07/18/2013 117* 70 - 99 mg/dL Final  . BUN 07/18/2013 12  6 - 23 mg/dL Final  . Creatinine, Ser 07/18/2013 0.70  0.50 - 1.35 mg/dL Final  . Calcium 07/18/2013 8.6  8.4 - 10.5 mg/dL Final  . GFR calc non Af Amer 07/18/2013 >90  >90 mL/min Final  . GFR calc Af Amer 07/18/2013 >90  >90 mL/min Final   Comment: (NOTE)                          The eGFR has been calculated using the CKD EPI equation.                          This calculation has not been validated in all clinical situations.                          eGFR's persistently <90  mL/min signify possible Chronic Kidney                          Disease.  Hospital Outpatient Visit on 06/29/2013  Component Date Value Ref Range Status  . Sodium 06/29/2013 140  137 - 147 mEq/L Final  . Potassium 06/29/2013 4.3  3.7 - 5.3 mEq/L Final  . Chloride 06/29/2013 104  96 - 112 mEq/L Final  . CO2 06/29/2013 26  19 - 32 mEq/L Final  . Glucose, Bld 06/29/2013 114* 70 - 99 mg/dL Final  . BUN 06/29/2013 15  6 - 23 mg/dL Final  . Creatinine, Ser 06/29/2013 0.79  0.50 - 1.35 mg/dL Final  . Calcium 06/29/2013 9.5  8.4 - 10.5 mg/dL Final  . GFR calc non Af Amer 06/29/2013 >90  >90 mL/min Final  . GFR calc Af Amer 06/29/2013 >90  >90 mL/min Final   Comment: (NOTE)                          The eGFR has been calculated using the CKD EPI equation.                          This calculation has not been validated in all clinical situations.                          eGFR's persistently <90 mL/min signify possible Chronic Kidney                          Disease.  . WBC 06/29/2013 7.6  4.0 - 10.5 K/uL Final  .  RBC 06/29/2013 4.94  4.22 - 5.81 MIL/uL Final  . Hemoglobin 06/29/2013 15.0  13.0 - 17.0 g/dL Final  . HCT 06/29/2013 43.0  39.0 - 52.0 % Final  . MCV 06/29/2013 87.0  78.0 - 100.0 fL Final  . MCH 06/29/2013 30.4  26.0 - 34.0 pg Final  . MCHC 06/29/2013 34.9  30.0 - 36.0 g/dL Final  . RDW 06/29/2013 12.4  11.5 - 15.5 % Final  . Platelets 06/29/2013 182  150 - 400 K/uL Final  . aPTT 06/29/2013 28  24 - 37 seconds Final  . Prothrombin Time 06/29/2013 12.9  11.6 - 15.2 seconds Final  . INR 06/29/2013 0.99  0.00 - 1.49 Final  . MRSA, PCR 06/29/2013 NEGATIVE  NEGATIVE Final  . Staphylococcus aureus 06/29/2013 NEGATIVE  NEGATIVE Final   Comment:                                 The Xpert SA Assay (FDA                          approved for NASAL specimens                          in patients over 29 years of age),                          is one component of                          a  comprehensive surveillance                          program.  Test performance has                          been validated by [employer] for patients greater                          than or equal to 46 year old.                          It is not intended                          to diagnose infection nor to                          guide or monitor treatment.  . Color, Urine 06/29/2013 YELLOW  YELLOW Final  . APPearance 06/29/2013 CLEAR  CLEAR Final  . Specific Gravity, Urine 06/29/2013 1.019  1.005 - 1.030 Final  . pH 06/29/2013 7.0  5.0 - 8.0 Final  . Glucose, UA 06/29/2013 NEGATIVE  NEGATIVE mg/dL Final  . Hgb urine dipstick 06/29/2013 NEGATIVE  NEGATIVE Final  . Bilirubin Urine 06/29/2013 NEGATIVE  NEGATIVE Final  . Ketones, ur 06/29/2013 NEGATIVE  NEGATIVE mg/dL Final  . Protein, ur 06/29/2013 NEGATIVE  NEGATIVE mg/dL Final  . Urobilinogen, UA 06/29/2013 1.0  0.0 -  1.0 mg/dL Final  . Nitrite 06/29/2013 NEGATIVE  NEGATIVE Final  . Leukocytes, UA 06/29/2013 NEGATIVE  NEGATIVE Final   MICROSCOPIC NOT DONE ON URINES WITH NEGATIVE PROTEIN, BLOOD, LEUKOCYTES, NITRITE, OR GLUCOSE <1000 mg/dL.  Office Visit on 06/13/2013  Component Date Value Ref Range Status  . WBC 06/13/2013 9.4  4.6 - 10.2 K/uL Final  . Lymph, poc 06/13/2013 3.3  0.6 - 3.4 Final  . POC LYMPH PERCENT 06/13/2013 34.8  10 - 50 %L Final  . POC Granulocyte 06/13/2013 5.9  2 - 6.9 Final  . Granulocyte percent 06/13/2013 63.2  37 - 80 %G Final  . RBC 06/13/2013 5.3  4.69 - 6.13 M/uL Final  . Hemoglobin 06/13/2013 15.5  14.1 - 18.1 g/dL Final  . HCT, POC 06/13/2013 47.6  43.5 - 53.7 % Final  . MCV 06/13/2013 89.1  80 - 97 fL Final  . MCH, POC 06/13/2013 29.1  27 - 31.2 pg Final  . MCHC 06/13/2013 32.6  31.8 - 35.4 g/dL Final  . RDW, POC 06/13/2013 12.5   Final  . Platelet Count, POC 06/13/2013 200.0  142 - 424 K/uL Final  . MPV 06/13/2013 8.5  0 - 99.8 fL Final  . Glucose 06/13/2013 103* 65 -  99 mg/dL Final  . BUN 06/13/2013 12  6 - 24 mg/dL Final  . Creatinine, Ser 06/13/2013 0.85  0.76 - 1.27 mg/dL Final  . GFR calc non Af Amer 06/13/2013 102  >59 mL/min/1.73 Final  . GFR calc Af Amer 06/13/2013 117  >59 mL/min/1.73 Final  . BUN/Creatinine Ratio 06/13/2013 14  9 - 20 Final  . Sodium 06/13/2013 139  134 - 144 mmol/L Final  . Potassium 06/13/2013 4.3  3.5 - 5.2 mmol/L Final  . Chloride 06/13/2013 102  97 - 108 mmol/L Final  . CO2 06/13/2013 24  18 - 29 mmol/L Final  . Calcium 06/13/2013 9.6  8.7 - 10.2 mg/dL Final  . Total Protein 06/13/2013 7.1  6.0 - 8.5 g/dL Final  . Albumin 06/13/2013 4.7  3.5 - 5.5 g/dL Final  . Globulin, Total 06/13/2013 2.4  1.5 - 4.5 g/dL Final  . Albumin/Globulin Ratio 06/13/2013 2.0  1.1 - 2.5 Final  . Total Bilirubin 06/13/2013 0.6  0.0 - 1.2 mg/dL Final  . Alkaline Phosphatase 06/13/2013 68  39 - 117 IU/L Final  . AST 06/13/2013 17  0 - 40 IU/L Final  . ALT 06/13/2013 25  0 - 44 IU/L Final     X-Rays:Dg Chest 2 View  06/19/2013   CLINICAL DATA:  Preop chest exam.  EXAM: CHEST  2 VIEW  COMPARISON:  None.  FINDINGS: The heart size and mediastinal contours are within normal limits. Both lungs are clear. The visualized skeletal structures are unremarkable.  IMPRESSION: No acute cardiopulmonary abnormality seen.   Electronically Signed   By: Sabino Dick M.D.   On: 06/19/2013 16:09    EKG: Orders placed in visit on 06/13/13  . EKG 12-LEAD     Hospital Course: Manuel Galloway is a 48 y.o. who was admitted to [facility]. They were brought to the operating room on 07/16/2013 and underwent Procedure(s): RIGHT TOTAL KNEE ARTHROPLASTY.  Patient tolerated the procedure well and was later transferred to the recovery room and then to the orthopaedic floor for postoperative care.  They were given PO and IV analgesics for pain control following their surgery.  They were given 24 hours of postoperative antibiotics of  Anti-infectives   Start  Dose/Rate  Route Frequency Ordered Stop   07/16/13 1700  ceFAZolin (ANCEF) IVPB 2 g/50 mL premix     2 g 100 mL/hr over 30 Minutes Intravenous Every 6 hours 07/16/13 1418 07/17/13 0043   07/16/13 1030  ceFAZolin (ANCEF) IVPB 2 g/50 mL premix     2 g 100 mL/hr over 30 Minutes Intravenous  Once 07/16/13 1023 07/16/13 1042     and started on DVT prophylaxis in the form of Xarelto.   PT and OT were ordered for total joint protocol.  Discharge planning consulted to help with postop disposition and equipment needs.  Patient had a good night on the evening of surgery.  They started to get up OOB with therapy on day one and could already do SLR's. Hemovac drain was pulled without difficulty.  Continued to work with therapy into day two.  Dressing was changed on day two and the incision was healing well.  Patient was seen in rounds and was ready to go home later after therapy on day two.  Discharge home with home health  Diet - Regular diet  Follow up - in 2 weeks  Activity - WBAT  Disposition - Home  Condition Upon Discharge - Good  D/C Meds - See DC Summary  DVT Prophylaxis - Xarelto       Discharge Instructions   Call MD / Call 911    Complete by:  As directed   If you experience chest pain or shortness of breath, CALL 911 and be transported to the hospital emergency room.  If you develope a fever above 101 F, pus (white drainage) or increased drainage or redness at the wound, or calf pain, call your surgeon's office.     Change dressing    Complete by:  As directed   Change dressing daily with sterile 4 x 4 inch gauze dressing and apply TED hose. Do not submerge the incision under water.     Constipation Prevention    Complete by:  As directed   Drink plenty of fluids.  Prune juice may be helpful.  You may use a stool softener, such as Colace (over the counter) 100 mg twice a day.  Use MiraLax (over the counter) for constipation as needed.     Diet general    Complete by:  As directed       Discharge instructions    Complete by:  As directed   Pick up stool softner and laxative for home. Do not submerge incision under water. May shower. Continue to use ice for pain and swelling from surgery.  Take Xarelto for two and a half more weeks, then discontinue Xarelto. Once the patient has completed the blood thinner regimen, then take a Baby 81 mg Aspirin daily for three more weeks.     Do not put a pillow under the knee. Place it under the heel.    Complete by:  As directed      Do not sit on low chairs, stoools or toilet seats, as it may be difficult to get up from low surfaces    Complete by:  As directed      Driving restrictions    Complete by:  As directed   No driving until released by the physician.     Increase activity slowly as tolerated    Complete by:  As directed      Lifting restrictions    Complete by:  As directed   No lifting until released by the  physician.     Patient may shower    Complete by:  As directed   You may shower without a dressing once there is no drainage.  Do not wash over the wound.  If drainage remains, do not shower until drainage stops.     TED hose    Complete by:  As directed   Use stockings (TED hose) for 3 weeks on both leg(s).  You may remove them at night for sleeping.     Weight bearing as tolerated    Complete by:  As directed             Medication List    STOP taking these medications       HYDROcodone-acetaminophen 5-325 MG per tablet  Commonly known as:  NORCO/VICODIN     ibuprofen 200 MG tablet  Commonly known as:  ADVIL,MOTRIN      TAKE these medications       acetaminophen 500 MG tablet  Commonly known as:  TYLENOL  Take 1,000 mg by mouth every 6 (six) hours as needed.     ALPRAZolam 0.5 MG tablet  Commonly known as:  XANAX  TAKE 1/2 TO 1 TABLET BY MOUTH 3 TIMES DAILY AS NEEDED     methocarbamol 500 MG tablet  Commonly known as:  ROBAXIN  Take 1 tablet (500 mg total) by mouth every 6 (six) hours as  needed for muscle spasms.     oxyCODONE 5 MG immediate release tablet  Commonly known as:  Oxy IR/ROXICODONE  Take 1-2 tablets (5-10 mg total) by mouth every 3 (three) hours as needed for moderate pain, severe pain or breakthrough pain.     PARoxetine 20 MG tablet  Commonly known as:  PAXIL  Take 10 mg by mouth every other day.     rivaroxaban 10 MG Tabs tablet  Commonly known as:  XARELTO  - Take 1 tablet (10 mg total) by mouth daily with breakfast. Take Xarelto for two and a half more weeks, then discontinue Xarelto.  - Once the patient has completed the blood thinner regimen, then take a Baby 81 mg Aspirin daily for three more weeks.       Follow-up Information   Follow up with Gearlean Alf, MD. Schedule an appointment as soon as possible for a visit in 2 weeks.   Specialty:  Orthopedic Surgery   Contact information:   [address] [place] [address] [phone]       Signed: Arlee Muslim, PA-C Orthopaedic Surgery 07/27/2013, 8:41 AM

## 2013-08-09 ENCOUNTER — Ambulatory Visit: Payer: BC Managed Care – PPO | Attending: Orthopedic Surgery | Admitting: Physical Therapy

## 2013-08-09 DIAGNOSIS — M25569 Pain in unspecified knee: Secondary | ICD-10-CM | POA: Insufficient documentation

## 2013-08-09 DIAGNOSIS — IMO0001 Reserved for inherently not codable concepts without codable children: Secondary | ICD-10-CM | POA: Diagnosis present

## 2013-08-09 DIAGNOSIS — R5381 Other malaise: Secondary | ICD-10-CM | POA: Diagnosis not present

## 2013-08-09 DIAGNOSIS — M25669 Stiffness of unspecified knee, not elsewhere classified: Secondary | ICD-10-CM | POA: Insufficient documentation

## 2013-08-09 DIAGNOSIS — Z96659 Presence of unspecified artificial knee joint: Secondary | ICD-10-CM | POA: Insufficient documentation

## 2013-08-13 ENCOUNTER — Ambulatory Visit: Payer: BC Managed Care – PPO | Admitting: Physical Therapy

## 2013-08-13 DIAGNOSIS — IMO0001 Reserved for inherently not codable concepts without codable children: Secondary | ICD-10-CM | POA: Diagnosis not present

## 2013-08-15 ENCOUNTER — Ambulatory Visit: Payer: BC Managed Care – PPO | Admitting: Physical Therapy

## 2013-08-15 DIAGNOSIS — IMO0001 Reserved for inherently not codable concepts without codable children: Secondary | ICD-10-CM | POA: Diagnosis not present

## 2013-08-17 ENCOUNTER — Ambulatory Visit: Payer: BC Managed Care – PPO | Admitting: Physical Therapy

## 2013-08-17 DIAGNOSIS — IMO0001 Reserved for inherently not codable concepts without codable children: Secondary | ICD-10-CM | POA: Diagnosis not present

## 2013-08-20 ENCOUNTER — Ambulatory Visit: Payer: BC Managed Care – PPO | Attending: Orthopedic Surgery | Admitting: Physical Therapy

## 2013-08-20 DIAGNOSIS — Z96659 Presence of unspecified artificial knee joint: Secondary | ICD-10-CM | POA: Diagnosis not present

## 2013-08-20 DIAGNOSIS — R5381 Other malaise: Secondary | ICD-10-CM | POA: Insufficient documentation

## 2013-08-20 DIAGNOSIS — M25569 Pain in unspecified knee: Secondary | ICD-10-CM | POA: Insufficient documentation

## 2013-08-20 DIAGNOSIS — M25669 Stiffness of unspecified knee, not elsewhere classified: Secondary | ICD-10-CM | POA: Diagnosis not present

## 2013-08-20 DIAGNOSIS — IMO0001 Reserved for inherently not codable concepts without codable children: Secondary | ICD-10-CM | POA: Insufficient documentation

## 2013-08-22 ENCOUNTER — Ambulatory Visit: Payer: BC Managed Care – PPO | Admitting: Physical Therapy

## 2013-08-22 ENCOUNTER — Encounter: Payer: BC Managed Care – PPO | Admitting: Physical Therapy

## 2013-08-22 DIAGNOSIS — IMO0001 Reserved for inherently not codable concepts without codable children: Secondary | ICD-10-CM | POA: Diagnosis not present

## 2013-08-23 ENCOUNTER — Ambulatory Visit: Payer: BC Managed Care – PPO | Admitting: Physical Therapy

## 2013-08-23 DIAGNOSIS — IMO0001 Reserved for inherently not codable concepts without codable children: Secondary | ICD-10-CM | POA: Diagnosis not present

## 2013-08-24 ENCOUNTER — Encounter: Payer: BC Managed Care – PPO | Admitting: Physical Therapy

## 2013-08-27 ENCOUNTER — Ambulatory Visit: Payer: BC Managed Care – PPO | Admitting: Physical Therapy

## 2013-08-27 DIAGNOSIS — IMO0001 Reserved for inherently not codable concepts without codable children: Secondary | ICD-10-CM | POA: Diagnosis not present

## 2013-08-29 ENCOUNTER — Ambulatory Visit: Payer: BC Managed Care – PPO | Admitting: Physical Therapy

## 2013-08-29 DIAGNOSIS — IMO0001 Reserved for inherently not codable concepts without codable children: Secondary | ICD-10-CM | POA: Diagnosis not present

## 2013-08-30 ENCOUNTER — Other Ambulatory Visit: Payer: Self-pay | Admitting: Family

## 2013-08-31 ENCOUNTER — Encounter: Payer: BC Managed Care – PPO | Admitting: *Deleted

## 2013-08-31 NOTE — Telephone Encounter (Signed)
Last seen 06/13/13 Manuel Galloway  If approved route to nurse to call into [facility]

## 2013-09-06 ENCOUNTER — Other Ambulatory Visit: Payer: Self-pay | Admitting: Family

## 2013-09-07 NOTE — Telephone Encounter (Signed)
rx called into pharmacy

## 2013-09-07 NOTE — Telephone Encounter (Signed)
Patient last seen in office on 06-13-13. Rx last filled on 07-10-13 for #[phone]. Please advise. If approved please route to Pool A so nurse can phone in to pharmacy

## 2013-11-05 ENCOUNTER — Other Ambulatory Visit: Payer: Self-pay | Admitting: Family

## 2013-11-06 NOTE — Telephone Encounter (Signed)
Last refill 09/07/13. Last ov 06/13/13. Route to nurse pool. If approved call to [place].

## 2014-01-14 ENCOUNTER — Other Ambulatory Visit: Payer: Self-pay | Admitting: Family Medicine

## 2014-01-28 ENCOUNTER — Encounter: Payer: Self-pay | Admitting: Internal Medicine

## 2014-01-30 ENCOUNTER — Ambulatory Visit (INDEPENDENT_AMBULATORY_CARE_PROVIDER_SITE_OTHER): Payer: BLUE CROSS/BLUE SHIELD

## 2014-01-30 ENCOUNTER — Ambulatory Visit (INDEPENDENT_AMBULATORY_CARE_PROVIDER_SITE_OTHER): Payer: BLUE CROSS/BLUE SHIELD | Admitting: Family Medicine

## 2014-01-30 ENCOUNTER — Encounter: Payer: Self-pay | Admitting: Family Medicine

## 2014-01-30 VITALS — BP 127/77 | HR 89 | Temp 98.8°F | Ht 75.0 in | Wt 258.6 lb

## 2014-01-30 DIAGNOSIS — M5441 Lumbago with sciatica, right side: Secondary | ICD-10-CM

## 2014-01-30 DIAGNOSIS — K5909 Other constipation: Secondary | ICD-10-CM

## 2014-01-30 DIAGNOSIS — R1031 Right lower quadrant pain: Secondary | ICD-10-CM

## 2014-01-30 LAB — POCT URINALYSIS DIPSTICK
Bilirubin, UA: NEGATIVE
Glucose, UA: NEGATIVE
Ketones, UA: NEGATIVE
Leukocytes, UA: NEGATIVE
Nitrite, UA: NEGATIVE
Spec Grav, UA: 1.01
Urobilinogen, UA: NEGATIVE
pH, UA: 6

## 2014-01-30 LAB — POCT CBC
Granulocyte percent: 56.9 %G (ref 37–80)
HCT, POC: 46.4 % (ref 43.5–53.7)
Hemoglobin: 14.8 g/dL (ref 14.1–18.1)
Lymph, poc: 3.2 (ref 0.6–3.4)
MCH, POC: 28.3 pg (ref 27–31.2)
MCHC: 31.8 g/dL (ref 31.8–35.4)
MCV: 86.1 fL (ref 80–97)
MPV: 8.2 fL (ref 0–99.8)
POC Granulocyte: 4.7 (ref 2–6.9)
POC LYMPH PERCENT: 39.1 %L (ref 10–50)
Platelet Count, POC: 214 10*3/uL (ref 142–424)
RBC: 5.2 M/uL (ref 4.69–6.13)
RDW, POC: 12.8 %
WBC: 8.2 10*3/uL (ref 4.6–10.2)

## 2014-01-30 LAB — POCT UA - MICROSCOPIC ONLY
Casts, Ur, LPF, POC: NEGATIVE
Crystals, Ur, HPF, POC: NEGATIVE
Yeast, UA: NEGATIVE

## 2014-01-30 MED ORDER — CYCLOBENZAPRINE HCL 10 MG PO TABS: 10.0000 mg | ORAL_TABLET | Freq: Three times a day (TID) | ORAL | Status: DC | PRN

## 2014-01-30 MED ORDER — POLYETHYLENE GLYCOL 3350 17 GM/SCOOP PO POWD: 17.0000 g | Freq: Two times a day (BID) | ORAL | Status: DC | PRN

## 2014-01-30 NOTE — Progress Notes (Signed)
   Subjective:    Patient ID: Manuel Galloway, male    DOB: 1965-12-25, 49 y.o.   MRN: [id number]  HPI C/o back and right abdomen pain for a day and not sure if having back pain due to kidney stone.  Review of Systems  Constitutional: Negative for fever.  HENT: Negative for ear pain.   Eyes: Negative for discharge.  Respiratory: Negative for cough.   Cardiovascular: Negative for chest pain.  Gastrointestinal: Negative for abdominal distention.  Endocrine: Negative for polyuria.  Genitourinary: Negative for difficulty urinating.  Musculoskeletal: Negative for gait problem and neck pain.  Skin: Negative for color change and rash.  Neurological: Negative for speech difficulty and headaches.  Psychiatric/Behavioral: Negative for agitation.       Objective:    BP 127/77 mmHg  Pulse 89  Temp(Src) 98.8 F (37.1 C) (Oral)  Ht 6\' 3"  (1.905 m)  Wt 258 lb 9.6 oz (117.3 kg)  BMI 32.32 kg/m2 Physical Exam  Constitutional: He is oriented to person, place, and time. He appears well-developed and well-nourished.  HENT:  Head: Normocephalic and atraumatic.  Mouth/Throat: Oropharynx is clear and moist.  Eyes: Pupils are equal, round, and reactive to light.  Neck: Normal range of motion. Neck supple.  Cardiovascular: Normal rate and regular rhythm.   No murmur heard. Pulmonary/Chest: Effort normal and breath sounds normal.  Abdominal: Soft. Bowel sounds are normal. There is no tenderness.  Neurological: He is alert and oriented to person, place, and time.  Skin: Skin is warm and dry.  Psychiatric: He has a normal mood and affect.          Assessment & Plan:     ICD-9-CM ICD-10-CM   1. Low back pain with right-sided sciatica, unspecified back pain laterality 724.3 M54.41 POCT CBC     POCT UA - Microscopic Only     POCT urinalysis dipstick     DG Abd 1 View     DG Lumbar Spine 2-3 Views     CANCELED: POCT CBC  2. Right lower quadrant abdominal pain 789.03 R10.31 POCT CBC     POCT  UA - Microscopic Only     POCT urinalysis dipstick     DG Abd 1 View     DG Lumbar Spine 2-3 Views     CANCELED: POCT CBC     No Follow-up on file.  Lysbeth Penner FNP

## 2014-01-31 ENCOUNTER — Telehealth: Payer: Self-pay | Admitting: Family Medicine

## 2014-01-31 NOTE — Telephone Encounter (Signed)
Ok for note 

## 2014-01-31 NOTE — Telephone Encounter (Signed)
Patient aware that letter is up front and ready to be picked up.

## 2014-01-31 NOTE — Telephone Encounter (Signed)
Is it ok for note?

## 2014-02-21 ENCOUNTER — Other Ambulatory Visit: Payer: Self-pay | Admitting: Family Medicine

## 2014-02-21 MED ORDER — ALPRAZOLAM 0.5 MG PO TABS: ORAL_TABLET | ORAL | Status: DC

## 2014-02-21 NOTE — Telephone Encounter (Signed)
Last seen 01/30/14, last filled 11/06/13. Call into [facility]

## 2014-02-22 NOTE — Telephone Encounter (Signed)
RF called to pharmacy

## 2014-03-22 ENCOUNTER — Ambulatory Visit (AMBULATORY_SURGERY_CENTER): Payer: Self-pay | Admitting: *Deleted

## 2014-03-22 VITALS — Ht 75.0 in | Wt 264.0 lb

## 2014-03-22 DIAGNOSIS — Z800 Family history of malignant neoplasm of digestive organs: Secondary | ICD-10-CM

## 2014-03-22 NOTE — Progress Notes (Signed)
No egg or soy allergy. No anesthesia problems.  No home O2.  No diet meds.  

## 2014-04-04 ENCOUNTER — Telehealth: Payer: Self-pay | Admitting: Internal Medicine

## 2014-04-04 NOTE — Telephone Encounter (Signed)
Patient complains of chest congestion with cough, chills, body aches, and temperature of 99.6.  He wants to have the procedure.  I advised him that if he is running 100.0 or more temperature, we would not be able to do the procedure.  I suggested he take his temperature first thing in the morning and see what it is before coming in .  He is to call the answering service and notify us.  If temperature is ok, he will come in as scheduled.

## 2014-04-05 ENCOUNTER — Encounter: Payer: Self-pay | Admitting: Internal Medicine

## 2014-04-05 ENCOUNTER — Ambulatory Visit (AMBULATORY_SURGERY_CENTER): Payer: BLUE CROSS/BLUE SHIELD | Admitting: Internal Medicine

## 2014-04-05 VITALS — BP 126/57 | HR 70 | Temp 98.5°F | Resp 19 | Ht 75.0 in | Wt 264.0 lb

## 2014-04-05 DIAGNOSIS — Z800 Family history of malignant neoplasm of digestive organs: Secondary | ICD-10-CM

## 2014-04-05 DIAGNOSIS — D124 Benign neoplasm of descending colon: Secondary | ICD-10-CM

## 2014-04-05 DIAGNOSIS — Z1211 Encounter for screening for malignant neoplasm of colon: Secondary | ICD-10-CM

## 2014-04-05 MED ORDER — SODIUM CHLORIDE 0.9 % IV SOLN: 500.0000 mL | INTRAVENOUS | Status: DC

## 2014-04-05 NOTE — Patient Instructions (Addendum)
I found and removed one small polyp that looks benign. I will let you know pathology results and when to have another routine colonoscopy by mail. I suspect I will recommend repeating in 5 years.  I appreciate the opportunity to care for you. Gatha Mayer, MD, FACG  YOU HAD AN ENDOSCOPIC PROCEDURE TODAY AT [facility]:   Refer to the procedure report that was given to you for any specific questions about what was found during the examination.  If the procedure report does not answer your questions, please call your gastroenterologist to clarify.  If you requested that your care partner not be given the details of your procedure findings, then the procedure report has been included in a sealed envelope for you to review at your convenience later.  YOU SHOULD EXPECT: Some feelings of bloating in the abdomen. Passage of more gas than usual.  Walking can help get rid of the air that was put into your GI tract during the procedure and reduce the bloating. If you had a lower endoscopy (such as a colonoscopy or flexible sigmoidoscopy) you may notice spotting of blood in your stool or on the toilet paper. If you underwent a bowel prep for your procedure, you may not have a normal bowel movement for a few days.  Please Note:  You might notice some irritation and congestion in your nose or some drainage.  This is from the oxygen used during your procedure.  There is no need for concern and it should clear up in a day or so.  SYMPTOMS TO REPORT IMMEDIATELY:   Following lower endoscopy (colonoscopy or flexible sigmoidoscopy):  Excessive amounts of blood in the stool  Significant tenderness or worsening of abdominal pains  Swelling of the abdomen that is new, acute  Fever of 100F or higher   Following upper endoscopy (EGD)  Vomiting of blood or coffee ground material  New chest pain or pain under the shoulder blades  Painful or persistently difficult swallowing  New  shortness of breath  Fever of 100F or higher  Black, tarry-looking stools  For urgent or emergent issues, a gastroenterologist can be reached at any hour by calling [phone].   DIET: Your first meal following the procedure should be a small meal and then it is ok to progress to your normal diet. Heavy or fried foods are harder to digest and may make you feel nauseous or bloated.  Likewise, meals heavy in dairy and vegetables can increase bloating.  Drink plenty of fluids but you should avoid alcoholic beverages for 24 hours.  ACTIVITY:  You should plan to take it easy for the rest of today and you should NOT DRIVE or use heavy machinery until tomorrow (because of the sedation medicines used during the test).    FOLLOW UP: Our staff will call the number listed on your records the next business day following your procedure to check on you and address any questions or concerns that you may have regarding the information given to you following your procedure. If we do not reach you, we will leave a message.  However, if you are feeling well and you are not experiencing any problems, there is no need to return our call.  We will assume that you have returned to your regular daily activities without incident.  If any biopsies were taken you will be contacted by phone or by letter within the next 1-3 weeks.  Please call us at [phone] if  you have not heard about the biopsies in 3 weeks.    SIGNATURES/CONFIDENTIALITY: You and/or your care partner have signed paperwork which will be entered into your electronic medical record.  These signatures attest to the fact that that the information above on your After Visit Summary has been reviewed and is understood.  Full responsibility of the confidentiality of this discharge information lies with you and/or your care-partner.YOU HAD AN ENDOSCOPIC PROCEDURE TODAY AT [facility]:   Refer to the procedure report that was given to you  for any specific questions about what was found during the examination.  If the procedure report does not answer your questions, please call your gastroenterologist to clarify.  If you requested that your care partner not be given the details of your procedure findings, then the procedure report has been included in a sealed envelope for you to review at your convenience later.  YOU SHOULD EXPECT: Some feelings of bloating in the abdomen. Passage of more gas than usual.  Walking can help get rid of the air that was put into your GI tract during the procedure and reduce the bloating. If you had a lower endoscopy (such as a colonoscopy or flexible sigmoidoscopy) you may notice spotting of blood in your stool or on the toilet paper. If you underwent a bowel prep for your procedure, you may not have a normal bowel movement for a few days.  Please Note:  You might notice some irritation and congestion in your nose or some drainage.  This is from the oxygen used during your procedure.  There is no need for concern and it should clear up in a day or so.  SYMPTOMS TO REPORT IMMEDIATELY:   Following lower endoscopy (colonoscopy or flexible sigmoidoscopy):  Excessive amounts of blood in the stool  Significant tenderness or worsening of abdominal pains  Swelling of the abdomen that is new, acute  Fever of 100F or higher    For urgent or emergent issues, a gastroenterologist can be reached at any hour by calling [phone].   DIET: Your first meal following the procedure should be a small meal and then it is ok to progress to your normal diet. Heavy or fried foods are harder to digest and may make you feel nauseous or bloated.  Likewise, meals heavy in dairy and vegetables can increase bloating.  Drink plenty of fluids but you should avoid alcoholic beverages for 24 hours.  ACTIVITY:  You should plan to take it easy for the rest of today and you should NOT DRIVE or use heavy machinery until tomorrow  (because of the sedation medicines used during the test).    FOLLOW UP: Our staff will call the number listed on your records the next business day following your procedure to check on you and address any questions or concerns that you may have regarding the information given to you following your procedure. If we do not reach you, we will leave a message.  However, if you are feeling well and you are not experiencing any problems, there is no need to return our call.  We will assume that you have returned to your regular daily activities without incident.  If any biopsies were taken you will be contacted by phone or by letter within the next 1-3 weeks.  Please call us at [phone] if you have not heard about the biopsies in 3 weeks.    SIGNATURES/CONFIDENTIALITY: You and/or your care partner have signed paperwork which will  be entered into your electronic medical record.  These signatures attest to the fact that that the information above on your After Visit Summary has been reviewed and is understood.  Full responsibility of the confidentiality of this discharge information lies with you and/or your care-partner.  Polyp information given.

## 2014-04-05 NOTE — Op Note (Signed)
[place]  [address] [place], [id number]   COLONOSCOPY PROCEDURE REPORT  PATIENT: Manuel Galloway, Manuel Galloway  MR#: [id number] BIRTHDATE: [birth year] , 49  yrs. old GENDER: male ENDOSCOPIST: Gatha Mayer, MD, [facility] PROCEDURE DATE:  04/05/2014 PROCEDURE:   Colonoscopy, screening and Colonoscopy with snare polypectomy First Screening Colonoscopy - Avg.  risk and is 50 yrs.  old or older Yes.  Prior Negative Screening - Now for repeat screening. N/A  History of Adenoma - Now for follow-up colonoscopy & has been > or = to 3 yrs.  N/A ASA CLASS:   Class II INDICATIONS:Screening for colonic neoplasia and FH Colon or Rectal Adenocarcinoma. MEDICATIONS: Propofol 340 mg IV and Monitored anesthesia care  DESCRIPTION OF PROCEDURE:   After the risks benefits and alternatives of the procedure were thoroughly explained, informed consent was obtained.  The digital rectal exam revealed no abnormalities of the rectum, revealed no prostatic nodules, and revealed the prostate was not enlarged.   The [id number] endoscope was introduced through the anus and advanced to the cecum, which was identified by both the appendix and ileocecal valve. No adverse events experienced.   The quality of the prep was excellent.  (MiraLax was used)  The instrument was then slowly withdrawn as the colon was fully examined.      COLON FINDINGS: A sessile polyp measuring 3 mm in size was found in the descending colon.  A polypectomy was performed with a cold snare.  The resection was complete, the polyp tissue was completely retrieved and sent to histology.   The examination was otherwise normal.  Retroflexed views revealed no abnormalities. The time to cecum = 2.9 Withdrawal time = 10.8   The scope was withdrawn and the procedure completed. COMPLICATIONS: There were no immediate complications.  ENDOSCOPIC IMPRESSION: 1.   Sessile polyp was found in the descending colon; polypectomy was  performed with a cold snare 2.   The examination was otherwise normal - excellent prep - FHx CRCA (father)  RECOMMENDATIONS: Timing of repeat colonoscopy will be determined by pathology findings. likely 5 years  eSigned:  Gatha Mayer, MD, [facility] 04/05/2014 10:07 AM   cc: The Patient and       DonMoore, MD

## 2014-04-05 NOTE — Progress Notes (Signed)
To recovery, report to [place], Therapist, sports. VSS

## 2014-04-05 NOTE — Progress Notes (Signed)
Called to room to assist during endoscopic procedure.  Patient ID and intended procedure confirmed with present staff. Received instructions for my participation in the procedure from the performing physician.  

## 2014-04-08 ENCOUNTER — Telehealth: Payer: Self-pay

## 2014-04-08 NOTE — Telephone Encounter (Signed)
  Follow up Call-  Call back number 04/05/2014  Post procedure Call Back phone  # [phone]  Permission to leave phone message Yes     Patient questions:  Do you have a fever, pain , or abdominal swelling? No. Pain Score  0 *  Have you tolerated food without any problems? Yes.    Have you been able to return to your normal activities? Yes.    Do you have any questions about your discharge instructions: Diet   No. Medications  No. Follow up visit  No.  Do you have questions or concerns about your Care? No.  Actions: * If pain score is 4 or above: No action needed, pain <4.

## 2014-04-10 ENCOUNTER — Encounter: Payer: Self-pay | Admitting: Internal Medicine

## 2014-04-10 DIAGNOSIS — Z860101 Personal history of adenomatous and serrated colon polyps: Secondary | ICD-10-CM | POA: Insufficient documentation

## 2014-04-10 DIAGNOSIS — Z800 Family history of malignant neoplasm of digestive organs: Secondary | ICD-10-CM | POA: Insufficient documentation

## 2014-04-10 DIAGNOSIS — Z86010 Personal history of colonic polyps: Secondary | ICD-10-CM

## 2014-04-10 HISTORY — DX: Personal history of colonic polyps: Z86.010

## 2014-04-10 HISTORY — DX: Personal history of adenomatous and serrated colon polyps: Z86.0101

## 2014-04-10 NOTE — Progress Notes (Signed)
Quick Note:  3 mm adenoma FHx CRCA Repeat colonoscopy 2021  ______

## 2014-05-30 ENCOUNTER — Telehealth: Payer: Self-pay | Admitting: Family Medicine

## 2014-05-30 DIAGNOSIS — Z1159 Encounter for screening for other viral diseases: Secondary | ICD-10-CM

## 2014-05-31 NOTE — Telephone Encounter (Signed)
This pt has only seen you and Dietrich Pates so I had to send it to you. Does he ntbs or can we just order hepatitis C?

## 2014-06-03 ENCOUNTER — Other Ambulatory Visit: Payer: Self-pay | Admitting: Family Medicine

## 2014-06-03 NOTE — Telephone Encounter (Signed)
Orders placed.

## 2014-06-03 NOTE — Telephone Encounter (Signed)
rx called into pharmacy

## 2014-06-03 NOTE — Telephone Encounter (Signed)
Patient aware.

## 2014-06-03 NOTE — Telephone Encounter (Signed)
Last seen 01/30/14 [place]  If approved route to nurse to call into [facility]

## 2014-06-03 NOTE — Telephone Encounter (Signed)
Please call in xanax with 1 refills 

## 2014-12-02 ENCOUNTER — Other Ambulatory Visit: Payer: Self-pay | Admitting: Nurse Practitioner

## 2015-01-02 ENCOUNTER — Encounter: Payer: Self-pay | Admitting: Family Medicine

## 2015-01-02 ENCOUNTER — Ambulatory Visit (INDEPENDENT_AMBULATORY_CARE_PROVIDER_SITE_OTHER): Payer: BLUE CROSS/BLUE SHIELD | Admitting: Family Medicine

## 2015-01-02 VITALS — BP 143/84 | HR 90 | Temp 97.3°F | Ht 75.0 in | Wt 264.0 lb

## 2015-01-02 DIAGNOSIS — Z0000 Encounter for general adult medical examination without abnormal findings: Secondary | ICD-10-CM | POA: Diagnosis not present

## 2015-01-02 DIAGNOSIS — F329 Major depressive disorder, single episode, unspecified: Secondary | ICD-10-CM | POA: Insufficient documentation

## 2015-01-02 DIAGNOSIS — F32A Depression, unspecified: Secondary | ICD-10-CM | POA: Insufficient documentation

## 2015-01-02 DIAGNOSIS — Z23 Encounter for immunization: Secondary | ICD-10-CM | POA: Diagnosis not present

## 2015-01-02 DIAGNOSIS — G479 Sleep disorder, unspecified: Secondary | ICD-10-CM | POA: Insufficient documentation

## 2015-01-02 MED ORDER — DULOXETINE HCL 60 MG PO CPEP: 60.0000 mg | ORAL_CAPSULE | Freq: Every day | ORAL | Status: DC

## 2015-01-02 NOTE — Progress Notes (Signed)
   HPI  Patient presents today for annual physical exam and labs.  He is not fasting.  He has no complaints today but needs a refill on Xanax.  He uses Xanax for sleep approximately one half pill per night on most nights, he's been out for about one month. He does have issues with anxiety and depression. He has a history of a suicide attempt when he was a teenager but denies suicidal ideation currently. 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  He takes approximately 800 mg ibuprofen or 1500 mg of acetaminophen daily for this. He agrees to cut back on the acetaminophen dose to thousand milligrams  PMH: Smoking status noted His past medical, surgical, social, family history reviewed and updated in EMR ROS: Per HPI  Objective: BP 143/84 mmHg  Pulse 90  Temp(Src) 97.3 F (36.3 C) (Oral)  Ht _0  (1.905 m)  Wt 264 lb (119.75 kg)  BMI 33.00 kg/m2 Gen: NAD, alert, cooperative with exam HEENT: NCAT, PERRLA, TMs normal bilaterally, nares clear, oropharynx clear CV: RRR, good S1/S2, no murmur Resp: CTABL, no wheezes, non-labored Abd: SNTND, BS present, no guarding or organomegaly Ext: No edema, warm Neuro: Alert and oriented, No gross deficits Psych: Normal mood and affect, denies suicidal ideation  Assessment and plan:  # Depression Starting Cymbalta today He has had success with Paxil previously and stopped it due to concerns about permanent changes in brain chemistry with long-term treatment. Denies suicidal ideation I also think this will benefit him as pertains to chronic musculoskeletal pain Stopping xanax, he hasn't taken in 1 month, he is not that attached.   # Annual exam Overall normal exam Nonfasting labs Flu shot given    Orders Placed This Encounter  Procedures  . Hepatitis C antibody  .  CBC    Standing Status: Future     Number of Occurrences:      Standing Expiration Date: 01/02/2016  . CMP14+EGFR  . Lipid Panel  . HIV antibody    Meds ordered this encounter  Medications  . DULoxetine (CYMBALTA) 60 MG capsule    Sig: Take 1 capsule (60 mg total) by mouth daily.    Dispense:  30 capsule    Refill:  [place], MD [place] 01/02/2015, 3:56 PM

## 2015-01-02 NOTE — Patient Instructions (Signed)
Great to meet you!  Lets see you back in 3-4 weeks  Try cymbalta for your mood, I think it will also help long term with your pain.    Taking the medicine as directed and not missing any doses is one of the best things you can do to treat your depression.  Here are some things to keep in mind:  1) Side effects (stomach upset, some increased anxiety) may happen before you notice a benefit.  These side effects typically go away over time. 2) Changes to your dose of medicine or a change in medication all together is sometimes necessary 3) Most people need to be on medication at least 6-12 months 4) Many people will notice an improvement within two weeks but the full effect of the medication can take up to 4-6 weeks 5) Stopping the medication when you start feeling better often results in a return of symptoms 6) If you start having thoughts of hurting yourself or others after starting this medicine, please call  [phone] immediately

## 2015-01-03 LAB — CMP14+EGFR
A/G RATIO: 1.7 (ref 1.1–2.5)
ALBUMIN: 4.7 g/dL (ref 3.5–5.5)
ALT: 29 IU/L (ref 0–44)
AST: 22 IU/L (ref 0–40)
Alkaline Phosphatase: 71 IU/L (ref 39–117)
BILIRUBIN TOTAL: 0.8 mg/dL (ref 0.0–1.2)
BUN / CREAT RATIO: 20 (ref 9–20)
BUN: 15 mg/dL (ref 6–24)
CALCIUM: 9.6 mg/dL (ref 8.7–10.2)
CHLORIDE: 100 mmol/L (ref 96–106)
CO2: 22 mmol/L (ref 18–29)
Creatinine, Ser: 0.74 mg/dL — ABNORMAL LOW (ref 0.76–1.27)
GFR, EST AFRICAN AMERICAN: 123 mL/min/{1.73_m2} (ref >59)
GFR, EST NON AFRICAN AMERICAN: 107 mL/min/{1.73_m2} (ref >59)
GLOBULIN, TOTAL: 2.7 g/dL (ref 1.5–4.5)
Glucose: 89 mg/dL (ref 65–99)
POTASSIUM: 4.2 mmol/L (ref 3.5–5.2)
SODIUM: 139 mmol/L (ref 134–144)
TOTAL PROTEIN: 7.4 g/dL (ref 6.0–8.5)

## 2015-01-03 LAB — LIPID PANEL
CHOL/HDL RATIO: 5.1 ratio — AB (ref 0.0–5.0)
Cholesterol, Total: 194 mg/dL (ref 100–199)
HDL: 38 mg/dL — ABNORMAL LOW (ref >39)
LDL Calculated: 124 mg/dL — ABNORMAL HIGH (ref 0–99)
Triglycerides: 160 mg/dL — ABNORMAL HIGH (ref 0–149)
VLDL Cholesterol Cal: 32 mg/dL (ref 5–40)

## 2015-01-03 LAB — HIV ANTIBODY (ROUTINE TESTING W REFLEX): HIV SCREEN 4TH GENERATION: NONREACTIVE

## 2015-01-03 LAB — HEPATITIS C ANTIBODY: HEP C VIRUS AB: 0.1 {s_co_ratio} (ref 0.0–0.9)

## 2015-01-09 ENCOUNTER — Encounter: Payer: Self-pay | Admitting: *Deleted

## 2015-01-30 ENCOUNTER — Encounter: Payer: Self-pay | Admitting: Family Medicine

## 2015-01-30 ENCOUNTER — Ambulatory Visit (INDEPENDENT_AMBULATORY_CARE_PROVIDER_SITE_OTHER): Payer: BLUE CROSS/BLUE SHIELD | Admitting: Family Medicine

## 2015-01-30 VITALS — BP 131/84 | HR 86 | Temp 97.7°F | Ht 75.0 in | Wt 266.8 lb

## 2015-01-30 DIAGNOSIS — F32A Depression, unspecified: Secondary | ICD-10-CM

## 2015-01-30 DIAGNOSIS — F329 Major depressive disorder, single episode, unspecified: Secondary | ICD-10-CM | POA: Diagnosis not present

## 2015-01-30 MED ORDER — ESCITALOPRAM OXALATE 10 MG PO TABS: 10.0000 mg | ORAL_TABLET | Freq: Every day | ORAL | Status: DC

## 2015-01-30 NOTE — Progress Notes (Signed)
   HPI  Patient presents today to discuss mood disorder  Patient has a long history of depression and anxiety successfully treated previously with Paxil. He had erectile dysfunction previously with Paxil. He also has a few musculoskeletal pain complaints so we tried Cymbalta. He's tolerating it okay, he had some increased anxiety for about 2 weeks which has resolved. However he has developed interval or erectile dysfunction. He denies suicidal ideation. He cannot tell any difference in his mood yet.  Elevated cholesterol Discussed diet and exercise with him, he has made many plans to get active in the new year. He's also planning to watch his diet.   PMH: Smoking status noted ROS: Per HPI  Objective: BP 131/84 mmHg  Pulse 86  Temp(Src) 97.7 F (36.5 C) (Oral)  Ht 6\' 3"  (1.905 m)  Wt 266 lb 12.8 oz (121.02 kg)  BMI 33.35 kg/m2 Gen: NAD, alert, cooperative with exam HEENT: NCAT CV: RRR, good S1/S2, no murmur Resp: CTABL, no wheezes, non-labored Ext: No edema, warm Neuro: Alert and oriented, No gross deficits  Assessment and plan:  # Depression, mood disorder Changing from Cymbalta to Lexapro due to erectile dysfunction Started with 10 mg of Lexapro, consider increasing at 3-4 week visit Follow up 3-4 weeks  # HLD Discussed Lifestyle changes    Meds ordered this encounter  Medications  . escitalopram (LEXAPRO) 10 MG tablet    Sig: Take 1 tablet (10 mg total) by mouth daily.    Dispense:  30 tablet    Refill:  Wright, MD [facility] 01/30/2015, 5:03 PM

## 2015-01-30 NOTE — Patient Instructions (Signed)
Great to see you!  I like your plans to get active  Stop cymbalta and start lexapro  Come back in 3-4 weeks  Taking the medicine as directed and not missing any doses is one of the best things you can do to treat your depression.  Here are some things to keep in mind:  1) Side effects (stomach upset, some increased anxiety) may happen before you notice a benefit.  These side effects typically go away over time. 2) Changes to your dose of medicine or a change in medication all together is sometimes necessary 3) Most people need to be on medication at least 6-12 months 4) Many people will notice an improvement within two weeks but the full effect of the medication can take up to 4-6 weeks 5) Stopping the medication when you start feeling better often results in a return of symptoms 6) If you start having thoughts of hurting yourself or others after starting this medicine, please call [phone] immediately

## 2015-02-28 ENCOUNTER — Encounter: Payer: Self-pay | Admitting: Family Medicine

## 2015-02-28 ENCOUNTER — Ambulatory Visit (INDEPENDENT_AMBULATORY_CARE_PROVIDER_SITE_OTHER): Payer: BLUE CROSS/BLUE SHIELD | Admitting: Family Medicine

## 2015-02-28 VITALS — BP 134/84 | HR 82 | Temp 97.8°F | Ht 75.0 in | Wt 265.4 lb

## 2015-02-28 DIAGNOSIS — F329 Major depressive disorder, single episode, unspecified: Secondary | ICD-10-CM

## 2015-02-28 DIAGNOSIS — F32A Depression, unspecified: Secondary | ICD-10-CM

## 2015-02-28 MED ORDER — DULOXETINE HCL 30 MG PO CPEP: ORAL_CAPSULE | ORAL | Status: DC

## 2015-02-28 MED ORDER — DULOXETINE HCL 60 MG PO CPEP: 60.0000 mg | ORAL_CAPSULE | Freq: Every day | ORAL | Status: DC

## 2015-02-28 NOTE — Patient Instructions (Signed)
Great to see you!  Start with 30 mg capsules (already sent to pharmacy) for 1 week then take 2 capsules daily until you run out  Then start 60 mg capsules (paper Rx)  Come back to see me in 2-3 months

## 2015-03-03 NOTE — Progress Notes (Signed)
   HPI  Patient presents today here to discuss depression  I saw him about 4 weeks ago and changed from cymbalta to lexapro due to some concerns around erections and increased anxiety with cymbalta  He denies SI  He is doing well but actually feels like he had much better control on cymbalta, would like to change  No other complaints  PMH: Smoking status noted ROS: Per HPI  Objective: BP 134/84 mmHg  Pulse 82  Temp(Src) 97.8 F (36.6 C) (Oral)  Ht 6\' 3"  (1.905 m)  Wt 265 lb 6.4 oz (120.385 kg)  BMI 33.17 kg/m2 Gen: NAD, alert, cooperative with exam HEENT: NCAT CV: RRR, good S1/S2, no murmur Resp: CTABL, no wheezes, non-labored Ext: No edema, warm Neuro: Alert and oriented, No gross deficits  Assessment and plan:  # Depression Change back to cymbalta, taper 30 for 1 week then 60 RTC in 2-3 months Rx given for 30 mg and 60 mg (month 2)    Meds ordered this encounter  Medications  . DULoxetine (CYMBALTA) 30 MG capsule    Sig: Take 1 pill daily for 1 week then 2 pills daily until pills are finished    Dispense:  60 capsule    Refill:  0  . DULoxetine (CYMBALTA) 60 MG capsule    Sig: Take 1 capsule (60 mg total) by mouth daily.    Dispense:  30 capsule    Refill:  Montz, MD [facility] 03/03/2015, 7:57 AM

## 2015-04-03 ENCOUNTER — Other Ambulatory Visit: Payer: Self-pay | Admitting: *Deleted

## 2015-04-03 MED ORDER — DULOXETINE HCL 60 MG PO CPEP: 60.0000 mg | ORAL_CAPSULE | Freq: Every day | ORAL | Status: DC

## 2015-04-09 ENCOUNTER — Other Ambulatory Visit: Payer: Self-pay | Admitting: Nurse Practitioner

## 2015-04-10 ENCOUNTER — Other Ambulatory Visit: Payer: Self-pay

## 2015-04-10 MED ORDER — POLYETHYLENE GLYCOL 3350 17 GM/SCOOP PO POWD: 1.0000 | Freq: Two times a day (BID) | ORAL | Status: DC

## 2015-04-28 ENCOUNTER — Other Ambulatory Visit: Payer: Self-pay | Admitting: *Deleted

## 2015-04-28 ENCOUNTER — Telehealth: Payer: Self-pay | Admitting: Family Medicine

## 2015-04-28 MED ORDER — DULOXETINE HCL 60 MG PO CPEP: 60.0000 mg | ORAL_CAPSULE | Freq: Every day | ORAL | Status: DC

## 2015-04-28 NOTE — Telephone Encounter (Signed)
Rx sent to pharmacy   

## 2015-05-12 ENCOUNTER — Telehealth: Payer: Self-pay | Admitting: Family Medicine

## 2015-05-13 ENCOUNTER — Telehealth: Payer: Self-pay | Admitting: Family Medicine

## 2015-05-13 MED ORDER — DULOXETINE HCL 30 MG PO CPEP: 30.0000 mg | ORAL_CAPSULE | Freq: Every day | ORAL | Status: DC

## 2015-05-13 NOTE — Telephone Encounter (Signed)
Dr B - which is the correct way ?

## 2015-05-13 NOTE — Telephone Encounter (Signed)
Left message- rx requested was sent to the pharmacy.  

## 2015-05-13 NOTE — Telephone Encounter (Signed)
1 pill once a day is the correct dose, My previous titration dose was writtten.   Manuel Apple, MD [facility] 05/13/2015, 5:12 PM

## 2015-05-13 NOTE — Telephone Encounter (Signed)
  Rx sent for 30 mg  Laroy Apple, MD [facility] 05/13/2015, 12:55 PM

## 2015-05-14 NOTE — Telephone Encounter (Signed)
I called the pharmacy and clarified the correct prescription. No further action is needed.

## 2015-06-05 ENCOUNTER — Other Ambulatory Visit: Payer: Self-pay | Admitting: *Deleted

## 2015-06-05 MED ORDER — DULOXETINE HCL 30 MG PO CPEP: 30.0000 mg | ORAL_CAPSULE | Freq: Every day | ORAL | Status: DC

## 2015-06-25 ENCOUNTER — Other Ambulatory Visit: Payer: Self-pay | Admitting: Family Medicine

## 2015-08-09 IMAGING — CR DG LUMBAR SPINE 2-3V
2 series · 2 of 2 positions shown · non-contrast
Comparison: None.

CLINICAL DATA: Low back pain with right-sided sciatic symptoms

EXAM:
LUMBAR SPINE - 2-3 VIEW

[view not recorded (1 of 2)]
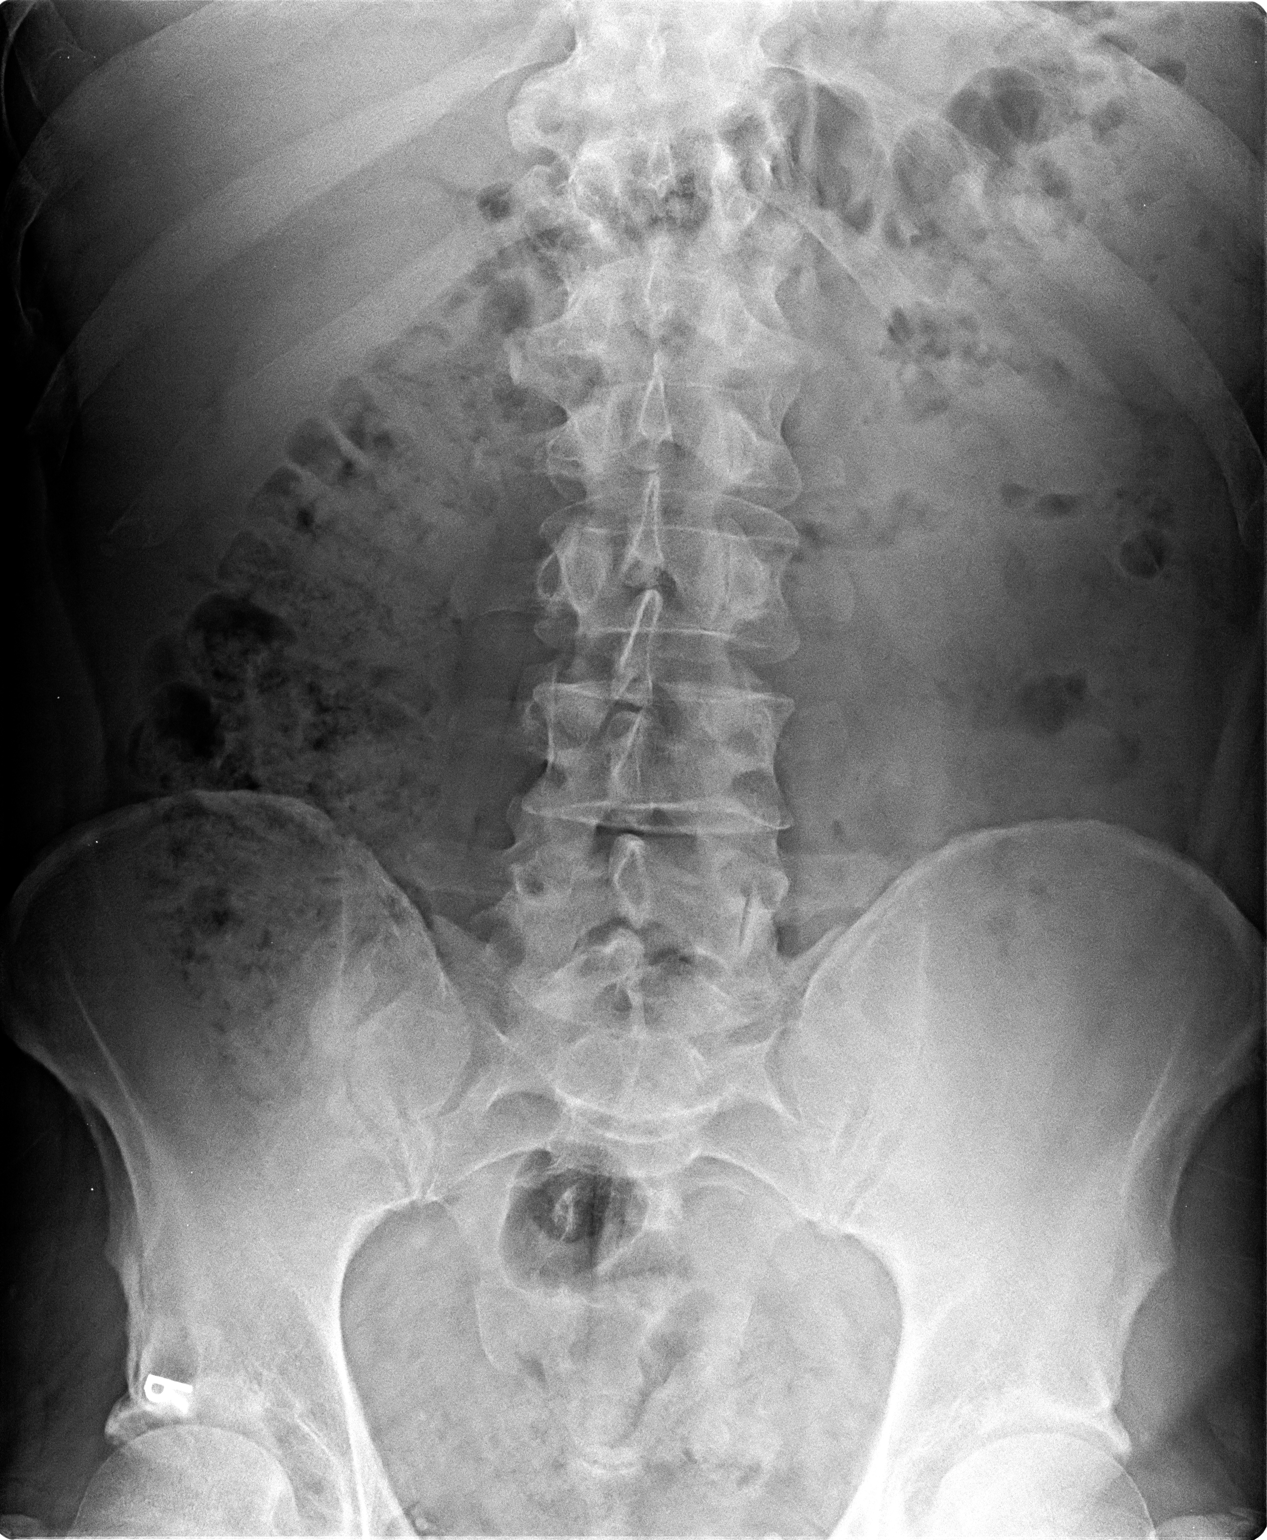

[view not recorded (2 of 2)]
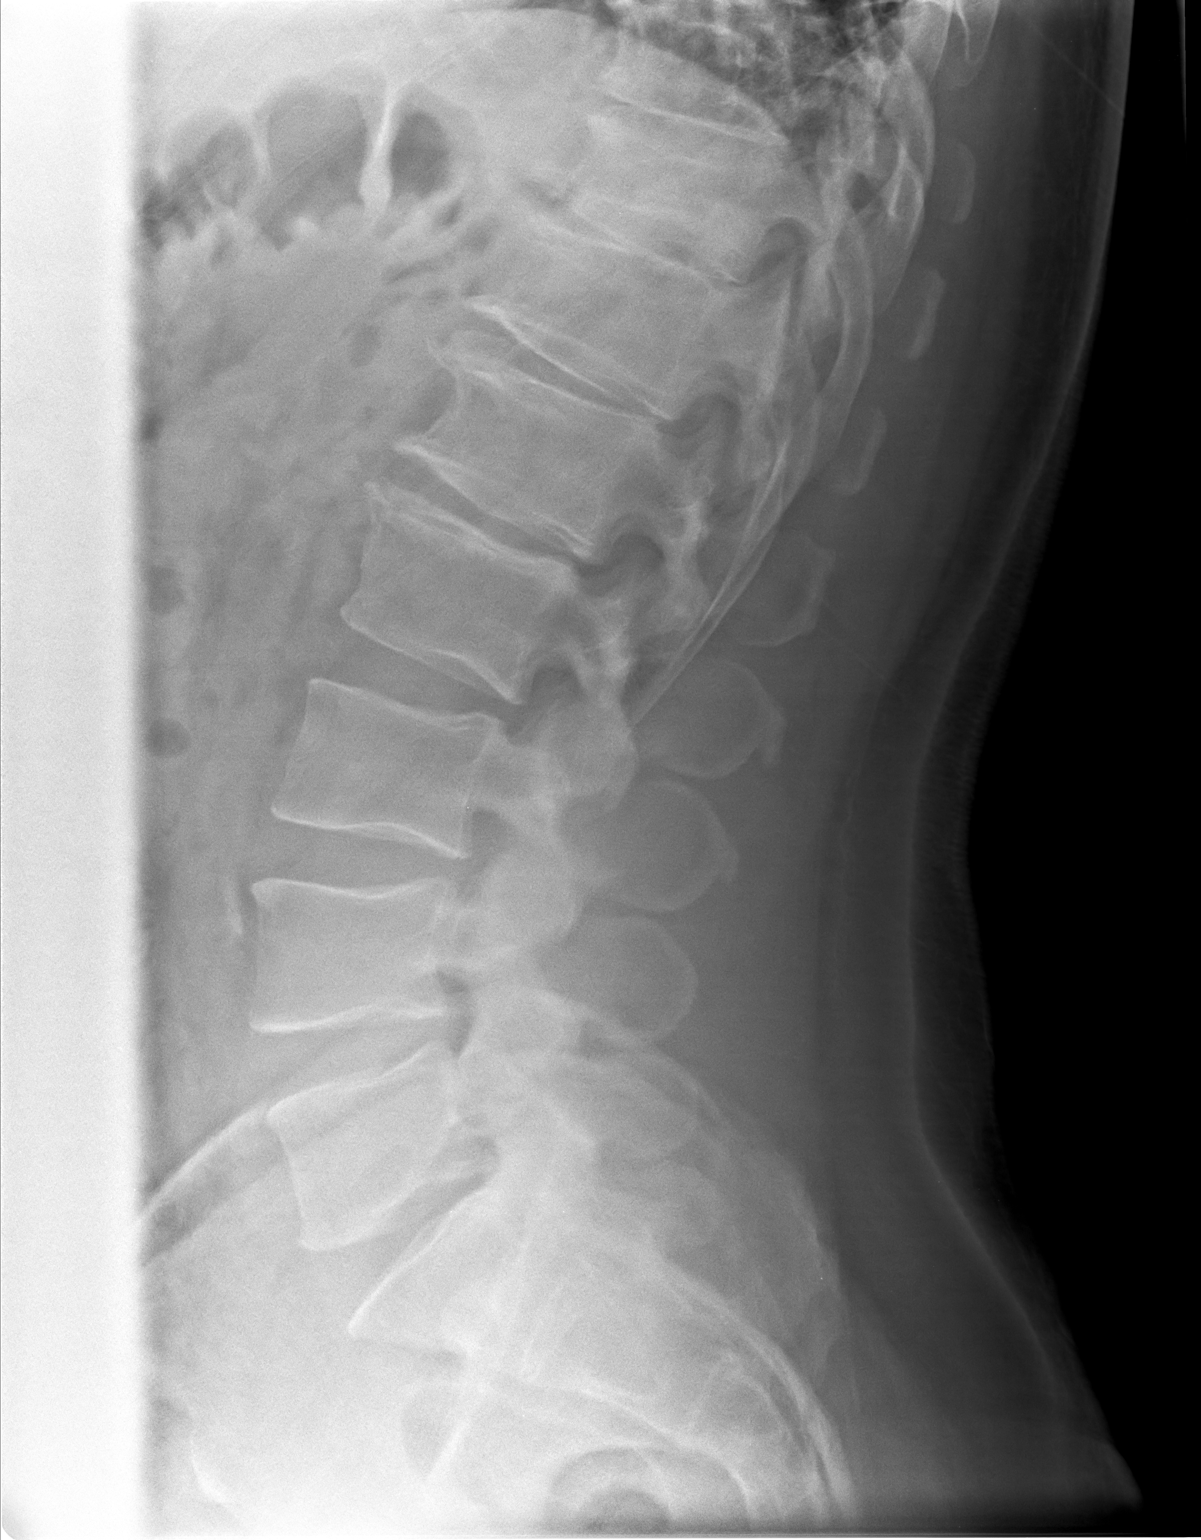

[2 of 2 positions shown; findings below may reference images not displayed]

FINDINGS: The lumbar vertebral bodies are preserved in height. There is disc
space narrowing at L1-2. There are anterior osteophytes at T12-L1
and L1-L2. There is no spondylolisthesis. The observed portions of
the sacrum are unremarkable.
IMPRESSION: There are degenerative changes of the lower thoracic and upper
lumbar spine. There is no acute compression fracture.

## 2015-08-26 ENCOUNTER — Other Ambulatory Visit: Payer: Self-pay | Admitting: Family Medicine

## 2015-11-18 ENCOUNTER — Ambulatory Visit (INDEPENDENT_AMBULATORY_CARE_PROVIDER_SITE_OTHER): Payer: BLUE CROSS/BLUE SHIELD | Admitting: Nurse Practitioner

## 2015-11-18 ENCOUNTER — Encounter: Payer: Self-pay | Admitting: Nurse Practitioner

## 2015-11-18 VITALS — Ht 75.0 in | Wt 273.0 lb

## 2015-11-18 DIAGNOSIS — L03116 Cellulitis of left lower limb: Secondary | ICD-10-CM | POA: Diagnosis not present

## 2015-11-18 MED ORDER — SULFAMETHOXAZOLE-TRIMETHOPRIM 800-160 MG PO TABS: 1.0000 | ORAL_TABLET | Freq: Two times a day (BID) | ORAL | 0 refills | Status: DC

## 2015-11-18 NOTE — Progress Notes (Addendum)
   Subjective:    Patient ID: Manuel Galloway, male    DOB: 08-30-1965, 50 y.o.   MRN: [id number]  HPI Patient comes in today c/o sore place on left knee- Started Saturday- looked like pimple. Has gradually gotten worse. nOw is red and sore and entire knee aches- he had a total nne replacement in 2014.  Review of Systems  Constitutional: Negative for chills and fever.  Eyes: Negative.   Respiratory: Negative.   Cardiovascular: Negative.   Gastrointestinal: Negative.   Genitourinary: Negative.   Skin: Negative.        Sore place on left knee  Neurological: Negative.  Negative for headaches.  Psychiatric/Behavioral: Negative.   All other systems reviewed and are negative.      Objective:   Physical Exam  Constitutional: He appears well-developed and well-nourished. No distress.  HENT:  Right Ear: External ear normal.  Left Ear: External ear normal.  Nose: Nose normal.  Mouth/Throat: Oropharynx is clear and moist.  Eyes: Conjunctivae are normal. Pupils are equal, round, and reactive to light.  Neck: Normal range of motion.  Cardiovascular: Normal rate, regular rhythm and normal heart sounds.   Pulmonary/Chest: Effort normal and breath sounds normal.  Neurological: He is alert.  Skin: Skin is warm.  3cm annular erythematous tender lesion with central pustule.  Psychiatric: He has a normal mood and affect. His behavior is normal. Judgment and thought content normal.   Ht 6\' 3"  (1.905 m)   Wt 273 lb (123.8 kg)   BMI 34.12 kg/m      Assessment & Plan:   1. Cellulitis of left knee    Meds ordered this encounter  Medications  . sulfamethoxazole-trimethoprim (BACTRIM DS) 800-160 MG tablet    Sig: Take 1 tablet by mouth 2 (two) times daily.    Dispense:  14 tablet    Refill:  0    Order Specific Question:   Supervising Provider    Answer:   Ricard Dillon [id number]   Warm compresses RTO prn  Mary-Margaret Hassell Done, FNP

## 2015-11-18 NOTE — Patient Instructions (Addendum)
Cellulitis °Cellulitis is an infection of the skin and the tissue under the skin. The infected area is usually red and tender. This happens most often in the arms and lower legs. °HOME CARE  °· Take your antibiotic medicine as told. Finish the medicine even if you start to feel better. °· Keep the infected arm or leg raised (elevated). °· Put a warm cloth on the area up to 4 times per day. °· Only take medicines as told by your doctor. °· Keep all doctor visits as told. °GET HELP IF: °· You see red streaks on the skin coming from the infected area. °· Your red area gets bigger or turns a dark color. °· Your bone or joint under the infected area is painful after the skin heals. °· Your infection comes back in the same area or different area. °· You have a puffy (swollen) bump in the infected area. °· You have new symptoms. °· You have a fever. °GET HELP RIGHT AWAY IF:  °· You feel very sleepy. °· You throw up (vomit) or have watery poop (diarrhea). °· You feel sick and have muscle aches and pains. °  °This information is not intended to replace advice given to you by your health care provider. Make sure you discuss any questions you have with your health care provider. °  °Document Released: 06/23/2007 Document Revised: 09/25/2014 Document Reviewed: 03/22/2011 °Elsevier Interactive Patient Education ©2016 [employer] ° °

## 2015-11-18 NOTE — Addendum Note (Signed)
Addended by: Chevis Pretty on: 11/18/2015 05:06 PM   Modules accepted: Orders

## 2015-11-24 ENCOUNTER — Other Ambulatory Visit: Payer: Self-pay | Admitting: Family Medicine

## 2016-02-02 ENCOUNTER — Other Ambulatory Visit: Payer: Self-pay | Admitting: Family Medicine

## 2016-02-04 NOTE — Telephone Encounter (Signed)
Last seen for depression 02/28/2015

## 2016-07-19 ENCOUNTER — Ambulatory Visit: Payer: BLUE CROSS/BLUE SHIELD | Admitting: Pediatrics

## 2017-06-17 ENCOUNTER — Ambulatory Visit (INDEPENDENT_AMBULATORY_CARE_PROVIDER_SITE_OTHER): Payer: BLUE CROSS/BLUE SHIELD | Admitting: Family Medicine

## 2017-06-17 ENCOUNTER — Encounter: Payer: Self-pay | Admitting: Family Medicine

## 2017-06-17 VITALS — BP 126/76 | HR 77 | Temp 97.7°F | Ht 75.0 in | Wt 263.2 lb

## 2017-06-17 DIAGNOSIS — M7918 Myalgia, other site: Secondary | ICD-10-CM | POA: Diagnosis not present

## 2017-06-17 DIAGNOSIS — M722 Plantar fascial fibromatosis: Secondary | ICD-10-CM | POA: Diagnosis not present

## 2017-06-17 DIAGNOSIS — F329 Major depressive disorder, single episode, unspecified: Secondary | ICD-10-CM | POA: Diagnosis not present

## 2017-06-17 DIAGNOSIS — E669 Obesity, unspecified: Secondary | ICD-10-CM

## 2017-06-17 DIAGNOSIS — F32A Depression, unspecified: Secondary | ICD-10-CM

## 2017-06-17 MED ORDER — DULOXETINE HCL 30 MG PO CPEP: 30.0000 mg | ORAL_CAPSULE | Freq: Every day | ORAL | 1 refills | Status: DC

## 2017-06-17 NOTE — Progress Notes (Signed)
   HPI  Patient presents today here for depression, pain, and labs.  Patient states that he previously quit Cymbalta due to concerns about fatigue.  Patient states that after he stopped he cannot really tell a difference in his energy level. He is depressed and down.  He denies SI. He has multiple musculoskeletal complaints that are persistent including bilateral shoulder and elbow pain.  He has had bilateral knee replacement.  Patient uses NSAIDs as needed. He is done well with cortisone injections to bilateral shoulders but not elbows. He does have some right heel pain concerning for plantar fasciitis.    PMH: Smoking status noted ROS: Per HPI  Objective: BP 126/76   Pulse 77   Temp 97.7 F (36.5 C) (Oral)   Ht '6\' 3"'$  (1.905 m)   Wt 263 lb 3.2 oz (119.4 kg)   BMI 32.90 kg/m  Gen: NAD, alert, cooperative with exam HEENT: NCAT CV: RRR, good S1/S2, no murmur Resp: CTABL, no wheezes, non-labored Ext: No edema, warm Neuro: Alert and oriented, No gross deficits Tenderness to palpation of the right heel over the calcaneus, no significant tenderness to palpation over the right plantar fascia  Assessment and plan:  #Depression Persistent depression, he believes he did improve previously on Cymbalta, he is not convinced that it caused his fatigue any longer. Restart Cymbalta  #Musculoskeletal pain Multiple areas of chronic musculoskeletal pain Starting Cymbalta  #Obesity Mild, patient reports losing 35 pounds in the fall and then gaining it back after he stopped his diet. Labs today  #Plantar fasciitis Right heel, handout from sports medicine patient advisor given Conservative therapy Patient's exam is not necessarily consistent with typical plantar fasciitis, however his symptoms are of right heel pain worse after sitting for an hour or more in the car and worse in the morning, improves after walking around for little while.   Orders Placed This Encounter  Procedures   . CMP14+EGFR  . CBC with Differential/Platelet  . Lipid panel  . TSH    Meds ordered this encounter  Medications  . DULoxetine (CYMBALTA) 30 MG capsule    Sig: Take 1 capsule (30 mg total) by mouth daily.    Dispense:  30 capsule    Refill:  Alma, MD [facility] 06/17/2017, 1:55 PM

## 2017-06-17 NOTE — Patient Instructions (Signed)
Great to see you!  Come back in 3-4 weeks for follow up

## 2017-06-18 LAB — LIPID PANEL
CHOL/HDL RATIO: 4.9 ratio (ref 0.0–5.0)
Cholesterol, Total: 202 mg/dL — ABNORMAL HIGH (ref 100–199)
HDL: 41 mg/dL (ref >39)
LDL CALC: 131 mg/dL — AB (ref 0–99)
TRIGLYCERIDES: 148 mg/dL (ref 0–149)
VLDL CHOLESTEROL CAL: 30 mg/dL (ref 5–40)

## 2017-06-18 LAB — CBC WITH DIFFERENTIAL/PLATELET
Basophils Absolute: 0 10*3/uL (ref 0.0–0.2)
Basos: 1 %
EOS (ABSOLUTE): 0.3 10*3/uL (ref 0.0–0.4)
Eos: 4 %
Hematocrit: 43 % (ref 37.5–51.0)
Hemoglobin: 15 g/dL (ref 13.0–17.7)
IMMATURE GRANULOCYTES: 0 %
Immature Grans (Abs): 0 10*3/uL (ref 0.0–0.1)
Lymphocytes Absolute: 2.5 10*3/uL (ref 0.7–3.1)
Lymphs: 36 %
MCH: 30.5 pg (ref 26.6–33.0)
MCHC: 34.9 g/dL (ref 31.5–35.7)
MCV: 88 fL (ref 79–97)
MONOS ABS: 0.5 10*3/uL (ref 0.1–0.9)
Monocytes: 7 %
NEUTROS PCT: 52 %
Neutrophils Absolute: 3.6 10*3/uL (ref 1.4–7.0)
PLATELETS: 188 10*3/uL (ref 150–450)
RBC: 4.91 x10E6/uL (ref 4.14–5.80)
RDW: 13.2 % (ref 12.3–15.4)
WBC: 6.9 10*3/uL (ref 3.4–10.8)

## 2017-06-18 LAB — CMP14+EGFR
A/G RATIO: 1.7 (ref 1.2–2.2)
ALT: 32 IU/L (ref 0–44)
AST: 21 IU/L (ref 0–40)
Albumin: 4.6 g/dL (ref 3.5–5.5)
Alkaline Phosphatase: 58 IU/L (ref 39–117)
BILIRUBIN TOTAL: 0.9 mg/dL (ref 0.0–1.2)
BUN/Creatinine Ratio: 20 (ref 9–20)
BUN: 18 mg/dL (ref 6–24)
CO2: 19 mmol/L — AB (ref 20–29)
CREATININE: 0.89 mg/dL (ref 0.76–1.27)
Calcium: 9.2 mg/dL (ref 8.7–10.2)
Chloride: 103 mmol/L (ref 96–106)
GFR calc Af Amer: 112 mL/min/{1.73_m2} (ref >59)
GFR calc non Af Amer: 97 mL/min/{1.73_m2} (ref >59)
GLOBULIN, TOTAL: 2.7 g/dL (ref 1.5–4.5)
Glucose: 103 mg/dL — ABNORMAL HIGH (ref 65–99)
POTASSIUM: 4 mmol/L (ref 3.5–5.2)
SODIUM: 139 mmol/L (ref 134–144)
Total Protein: 7.3 g/dL (ref 6.0–8.5)

## 2017-06-18 LAB — TSH: TSH: 2.57 u[IU]/mL (ref 0.450–4.500)

## 2017-07-01 ENCOUNTER — Encounter: Payer: BLUE CROSS/BLUE SHIELD | Admitting: Family Medicine

## 2017-07-18 ENCOUNTER — Ambulatory Visit: Payer: BLUE CROSS/BLUE SHIELD | Admitting: Family Medicine

## 2017-07-26 ENCOUNTER — Ambulatory Visit (INDEPENDENT_AMBULATORY_CARE_PROVIDER_SITE_OTHER): Payer: BLUE CROSS/BLUE SHIELD | Admitting: Family Medicine

## 2017-07-26 ENCOUNTER — Encounter: Payer: Self-pay | Admitting: Family Medicine

## 2017-07-26 VITALS — BP 131/87 | HR 94 | Temp 99.0°F | Ht 75.0 in | Wt 271.6 lb

## 2017-07-26 DIAGNOSIS — F329 Major depressive disorder, single episode, unspecified: Secondary | ICD-10-CM

## 2017-07-26 DIAGNOSIS — F32A Depression, unspecified: Secondary | ICD-10-CM

## 2017-07-26 MED ORDER — DULOXETINE HCL 60 MG PO CPEP: 60.0000 mg | ORAL_CAPSULE | Freq: Every day | ORAL | 1 refills | Status: DC

## 2017-07-26 NOTE — Progress Notes (Signed)
   HPI  Patient presents today for follow-up depression.  Patient feels much better on Cymbalta.  Patient states he had an unusual sensation and felt like he had had too much caffeine when he first started 30 mg a day, he then reduced it to 30 mg every other day, then slowly worked up to 30 mg a day, he is now taking 60 mg a day.  Mild dry mouth with the medication  PMH: Smoking status noted ROS: Per HPI  Objective: BP 131/87   Pulse 94   Temp 99 F (37.2 C) (Oral)   Ht 6\' 3"  (1.905 m)   Wt 271 lb 9.6 oz (123.2 kg)   BMI 33.95 kg/m  Gen: NAD, alert, cooperative with exam HEENT: NCAT CV: RRR, good S1/S2, no murmur Resp: CTABL, no wheezes, non-labored Ext: No edema, warm Neuro: Alert and oriented, No gross deficits  Assessment and plan:  #Depression Improved on Cymbalta, also has noticed musculoskeletal pain improvement. Follow-up 6 months, refill 60 mg Cymbalta    Meds ordered this encounter  Medications  . DULoxetine (CYMBALTA) 60 MG capsule    Sig: Take 1 capsule (60 mg total) by mouth daily.    Dispense:  90 capsule    Refill:  Laurel, MD [facility] 07/26/2017, 4:49 PM

## 2017-07-26 NOTE — Patient Instructions (Signed)
Great to see you!  See Particia Nearing in 4-6 months for follow up depression

## 2018-01-23 ENCOUNTER — Encounter: Payer: Self-pay | Admitting: Physician Assistant

## 2018-01-23 ENCOUNTER — Ambulatory Visit (INDEPENDENT_AMBULATORY_CARE_PROVIDER_SITE_OTHER): Payer: BLUE CROSS/BLUE SHIELD | Admitting: Physician Assistant

## 2018-01-23 VITALS — BP 127/87 | HR 98 | Temp 97.5°F | Ht 75.0 in | Wt 276.0 lb

## 2018-01-23 DIAGNOSIS — M5136 Other intervertebral disc degeneration, lumbar region: Secondary | ICD-10-CM | POA: Diagnosis not present

## 2018-01-23 DIAGNOSIS — F329 Major depressive disorder, single episode, unspecified: Secondary | ICD-10-CM

## 2018-01-23 DIAGNOSIS — Z23 Encounter for immunization: Secondary | ICD-10-CM

## 2018-01-23 DIAGNOSIS — F32A Depression, unspecified: Secondary | ICD-10-CM

## 2018-01-23 DIAGNOSIS — F419 Anxiety disorder, unspecified: Secondary | ICD-10-CM

## 2018-01-23 MED ORDER — GABAPENTIN 100 MG PO CAPS: 100.0000 mg | ORAL_CAPSULE | Freq: Every day | ORAL | 3 refills | Status: DC

## 2018-01-23 MED ORDER — PAROXETINE HCL 10 MG PO TABS: 10.0000 mg | ORAL_TABLET | Freq: Every day | ORAL | 1 refills | Status: DC

## 2018-01-26 DIAGNOSIS — M5136 Other intervertebral disc degeneration, lumbar region: Secondary | ICD-10-CM | POA: Insufficient documentation

## 2018-01-26 DIAGNOSIS — F419 Anxiety disorder, unspecified: Secondary | ICD-10-CM | POA: Insufficient documentation

## 2018-01-26 NOTE — Progress Notes (Signed)
BP 127/87   Pulse 98   Temp (!) 97.5 F (36.4 C) (Oral)   Ht 6' 3"  (1.905 m)   Wt 276 lb (125.2 kg)   BMI 34.50 kg/m    Subjective:    Patient ID: Manuel Galloway, male    DOB: 20-Aug-1965, 53 y.o.   MRN: [id number]  HPI: Manuel Galloway is a 53 y.o. male presenting on 01/23/2018 for Depression (6 month follow up )  This patient comes in for 52-monthfollow-up on his medical condition.  He does have depression and anxiety.  He does feel like he has no motivation on the duloxetine.  It was drying out so he started to titrated off but has only gotten down to 30 mg states overall he feels anxious most.  He does have significant aches and pains most days.  He does have degenerative disc disease with disc trusion around the right sciatic nerve.  He was told to have surgery but is really trying to avoid doing this.  He said that the duloxetine did not make a difference with the pain.  He has never tried gabapentin.  We discussed the possibility of starting it in a few weeks after we have titrated him off and on Maxalt.  He has taken that in the past and tolerated it well.  Past Medical History:  Diagnosis Date  . Anxiety   . Arthritis   . Depression   . Hx of adenomatous polyp of colon 04/10/2014   Relevant past medical, surgical, family and social history reviewed and updated as indicated. Interim medical history since our last visit reviewed. Allergies and medications reviewed and updated. DATA REVIEWED: CHART IN EPIC  Family History reviewed for pertinent findings.  Review of Systems  Constitutional: Negative.  Negative for appetite change and fatigue.  Eyes: Negative for pain and visual disturbance.  Respiratory: Negative.  Negative for cough, chest tightness, shortness of breath and wheezing.   Cardiovascular: Negative.  Negative for chest pain, palpitations and leg swelling.  Gastrointestinal: Negative.  Negative for abdominal pain, diarrhea, nausea and vomiting.  Genitourinary:  Negative.   Musculoskeletal: Positive for arthralgias, back pain and myalgias.  Skin: Negative.  Negative for color change and rash.  Neurological: Negative.  Negative for weakness, numbness and headaches.  Psychiatric/Behavioral: Positive for decreased concentration and dysphoric mood. The patient is nervous/anxious.     Allergies as of 01/23/2018      Reactions   Fish Allergy Shortness Of Breath, Swelling      Medication List       Accurate as of January 23, 2018 11:59 PM. Always use your most recent med list.        acetaminophen 500 MG tablet Commonly known as:  TYLENOL Take 1,000 mg by mouth every 6 (six) hours as needed.   diphenhydrAMINE 25 MG tablet Commonly known as:  BENADRYL Take 25 mg by mouth every 6 (six) hours as needed.   DULoxetine 60 MG capsule Commonly known as:  CYMBALTA Take 1 capsule (60 mg total) by mouth daily.   Fish Oil 1200 MG Caps Take by mouth.   gabapentin 100 MG capsule Commonly known as:  NEURONTIN Take 1-3 capsules (100-300 mg total) by mouth at bedtime.   ibuprofen 200 MG tablet Commonly known as:  ADVIL,MOTRIN Take 200 mg by mouth every 6 (six) hours as needed.   PARoxetine 10 MG tablet Commonly known as:  PAXIL Take 1-2 tablets (10-20 mg total) by mouth daily.  Objective:    BP 127/87   Pulse 98   Temp (!) 97.5 F (36.4 C) (Oral)   Ht 6' 3"  (1.905 m)   Wt 276 lb (125.2 kg)   BMI 34.50 kg/m   Allergies  Allergen Reactions  . Fish Allergy Shortness Of Breath and Swelling    Wt Readings from Last 3 Encounters:  01/23/18 276 lb (125.2 kg)  07/26/17 271 lb 9.6 oz (123.2 kg)  06/17/17 263 lb 3.2 oz (119.4 kg)    Physical Exam Vitals signs and nursing note reviewed.  Constitutional:      General: He is not in acute distress.    Appearance: He is well-developed.  HENT:     Head: Normocephalic and atraumatic.  Eyes:     Conjunctiva/sclera: Conjunctivae normal.     Pupils: Pupils are equal, round, and  reactive to light.  Cardiovascular:     Rate and Rhythm: Normal rate and regular rhythm.     Heart sounds: Normal heart sounds.  Pulmonary:     Effort: Pulmonary effort is normal. No respiratory distress.     Breath sounds: Normal breath sounds.  Musculoskeletal:     Lumbar back: He exhibits decreased range of motion, pain and spasm.       Back:  Skin:    General: Skin is warm and dry.  Psychiatric:        Behavior: Behavior normal.     Results for orders placed or performed in visit on 06/17/17  CMP14+EGFR  Result Value Ref Range   Glucose 103 (H) 65 - 99 mg/dL   BUN 18 6 - 24 mg/dL   Creatinine, Ser 0.89 0.76 - 1.27 mg/dL   GFR calc non Af Amer 97 >59 mL/min/1.73   GFR calc Af Amer 112 >59 mL/min/1.73   BUN/Creatinine Ratio 20 9 - 20   Sodium 139 134 - 144 mmol/L   Potassium 4.0 3.5 - 5.2 mmol/L   Chloride 103 96 - 106 mmol/L   CO2 19 (L) 20 - 29 mmol/L   Calcium 9.2 8.7 - 10.2 mg/dL   Total Protein 7.3 6.0 - 8.5 g/dL   Albumin 4.6 3.5 - 5.5 g/dL   Globulin, Total 2.7 1.5 - 4.5 g/dL   Albumin/Globulin Ratio 1.7 1.2 - 2.2   Bilirubin Total 0.9 0.0 - 1.2 mg/dL   Alkaline Phosphatase 58 39 - 117 IU/L   AST 21 0 - 40 IU/L   ALT 32 0 - 44 IU/L  CBC with Differential/Platelet  Result Value Ref Range   WBC 6.9 3.4 - 10.8 x10E3/uL   RBC 4.91 4.14 - 5.80 x10E6/uL   Hemoglobin 15.0 13.0 - 17.7 g/dL   Hematocrit 43.0 37.5 - 51.0 %   MCV 88 79 - 97 fL   MCH 30.5 26.6 - 33.0 pg   MCHC 34.9 31.5 - 35.7 g/dL   RDW 13.2 12.3 - 15.4 %   Platelets 188 150 - 450 x10E3/uL   Neutrophils 52 Not Estab. %   Lymphs 36 Not Estab. %   Monocytes 7 Not Estab. %   Eos 4 Not Estab. %   Basos 1 Not Estab. %   Neutrophils Absolute 3.6 1.4 - 7.0 x10E3/uL   Lymphocytes Absolute 2.5 0.7 - 3.1 x10E3/uL   Monocytes Absolute 0.5 0.1 - 0.9 x10E3/uL   EOS (ABSOLUTE) 0.3 0.0 - 0.4 x10E3/uL   Basophils Absolute 0.0 0.0 - 0.2 x10E3/uL   Immature Granulocytes 0 Not Estab. %   Immature Grans (Abs)  0.0 0.0 - 0.1 x10E3/uL  Lipid panel  Result Value Ref Range   Cholesterol, Total 202 (H) 100 - 199 mg/dL   Triglycerides 148 0 - 149 mg/dL   HDL 41 >39 mg/dL   VLDL Cholesterol Cal 30 5 - 40 mg/dL   LDL Calculated 131 (H) 0 - 99 mg/dL   Chol/HDL Ratio 4.9 0.0 - 5.0 ratio  TSH  Result Value Ref Range   TSH 2.570 0.450 - 4.500 uIU/mL      Assessment & Plan:   1. DDD (degenerative disc disease), lumbar - gabapentin (NEURONTIN) 100 MG capsule; Take 1-3 capsules (100-300 mg total) by mouth at bedtime.  Dispense: 90 capsule; Refill: 3  2. Need for immunization against influenza - Flu Vaccine QUAD 36+ mos IM  3. Depression, unspecified depression type - PARoxetine (PAXIL) 10 MG tablet; Take 1-2 tablets (10-20 mg total) by mouth daily.  Dispense: 60 tablet; Refill: 1  4. Anxiety - PARoxetine (PAXIL) 10 MG tablet; Take 1-2 tablets (10-20 mg total) by mouth daily.  Dispense: 60 tablet; Refill: 1   Continue all other maintenance medications as listed above.  Follow up plan: Return in about 4 weeks (around 02/20/2018) for record release MRI [facility].  Educational handout given for [place] PA-C [place] [address]  [place], [place] [address] [phone]   01/26/2018, 8:36 PM

## 2018-02-15 ENCOUNTER — Other Ambulatory Visit: Payer: Self-pay | Admitting: Physician Assistant

## 2018-02-15 DIAGNOSIS — F419 Anxiety disorder, unspecified: Secondary | ICD-10-CM

## 2018-02-15 DIAGNOSIS — F32A Depression, unspecified: Secondary | ICD-10-CM

## 2018-02-15 DIAGNOSIS — F329 Major depressive disorder, single episode, unspecified: Secondary | ICD-10-CM

## 2018-02-20 ENCOUNTER — Ambulatory Visit: Payer: BLUE CROSS/BLUE SHIELD | Admitting: Physician Assistant

## 2018-04-12 ENCOUNTER — Other Ambulatory Visit: Payer: Self-pay

## 2018-04-12 ENCOUNTER — Telehealth (INDEPENDENT_AMBULATORY_CARE_PROVIDER_SITE_OTHER): Payer: BLUE CROSS/BLUE SHIELD | Admitting: Physician Assistant

## 2018-04-12 DIAGNOSIS — M1711 Unilateral primary osteoarthritis, right knee: Secondary | ICD-10-CM | POA: Diagnosis not present

## 2018-04-12 DIAGNOSIS — F331 Major depressive disorder, recurrent, moderate: Secondary | ICD-10-CM | POA: Diagnosis not present

## 2018-04-12 DIAGNOSIS — F32A Depression, unspecified: Secondary | ICD-10-CM

## 2018-04-12 DIAGNOSIS — M5136 Other intervertebral disc degeneration, lumbar region: Secondary | ICD-10-CM | POA: Diagnosis not present

## 2018-04-12 DIAGNOSIS — M51369 Other intervertebral disc degeneration, lumbar region without mention of lumbar back pain or lower extremity pain: Secondary | ICD-10-CM

## 2018-04-12 DIAGNOSIS — F329 Major depressive disorder, single episode, unspecified: Secondary | ICD-10-CM

## 2018-04-12 MED ORDER — BUPROPION HCL ER (XL) 150 MG PO TB24: 150.0000 mg | ORAL_TABLET | Freq: Every day | ORAL | 5 refills | Status: DC

## 2018-04-12 NOTE — Progress Notes (Signed)
Telephone visit  Subjective: Manuel Galloway chronic back pain and depression PCP: Manuel Sleeper, PA-C Manuel Galloway is a 53 y.o. male calls for telephone consult today. Patient provides verbal consent for consult held via phone.  Location of patient: work Location of provider: [facility] Others present for call: no   This visit is for recheck on the patient's chronic medical conditions of degenerative disc disease with back pain and joint pain with osteoarthritis and depression.  He was seen a few months ago and increased with Cymbalta.  He does not feel that his depression is doing a whole lot better.  He does not feel worse however he still has a lot of days where he has poor motivation and lack of interest in things that he normally would do.   We had discussed restarting Wellbutrin for no motivation concentration and decreased interest and emotional eating.  He is willing to start this up.  We can build it up over the next month and I am going to recheck with him in 1 month.  ROS: Per HPI  Allergies  Allergen Reactions  . Fish Allergy Shortness Of Breath and Swelling   Past Medical History:  Diagnosis Date  . Anxiety   . Arthritis   . Depression   . Hx of adenomatous polyp of colon 04/10/2014    Current Outpatient Medications:  .  acetaminophen (TYLENOL) 500 MG tablet, Take 1,000 mg by mouth every 6 (six) hours as needed., Disp: , Rfl:  .  buPROPion (WELLBUTRIN XL) 150 MG 24 hr tablet, Take 1-2 tablets (150-300 mg total) by mouth daily. Take 1 tab QAM 1 week then 2 QAM, Disp: 60 tablet, Rfl: 5 .  diphenhydrAMINE (BENADRYL) 25 MG tablet, Take 25 mg by mouth every 6 (six) hours as needed., Disp: , Rfl:  .  DULoxetine (CYMBALTA) 60 MG capsule, Take 1 capsule (60 mg total) by mouth daily., Disp: 90 capsule, Rfl: 1 .  gabapentin (NEURONTIN) 100 MG capsule, Take 1-3 capsules (100-300 mg total) by mouth at bedtime., Disp: 90 capsule, Rfl: 3 .  ibuprofen (ADVIL,MOTRIN) 200 MG tablet,  Take 200 mg by mouth every 6 (six) hours as needed., Disp: , Rfl:  .  Omega-3 Fatty Acids (FISH OIL) 1200 MG CAPS, Take by mouth., Disp: , Rfl:  .  PARoxetine (PAXIL) 10 MG tablet, TAKE 1-2 TABLETS (10-20 MG TOTAL) BY MOUTH DAILY., Disp: 180 tablet, Rfl: 0  Assessment/ Plan: 53 y.o. male   1. DDD (degenerative disc disease), lumbar Gabapentin, try to build up to 300 mg at bedtime, even if it takes a few months. Ibuprofen  2. Depression, unspecified depression type Continue paxil 10 mg 1 daily Start: - buPROPion (WELLBUTRIN XL) 150 MG 24 hr tablet; Take 1-2 tablets (150-300 mg total) by mouth daily. Take 1 tab QAM 1 week then 2 QAM  Dispense: 60 tablet; Refill: 5  3. Primary osteoarthritis of right knee See above Checking on MRI request from [facility].  A release was signed on 01/23/2018.  I do not know the result has arrived yet.    Start time: 3:10 PM End time: 3:23pm  Meds ordered this encounter  Medications  . buPROPion (WELLBUTRIN XL) 150 MG 24 hr tablet    Sig: Take 1-2 tablets (150-300 mg total) by mouth daily. Take 1 tab QAM 1 week then 2 QAM    Dispense:  60 tablet    Refill:  5    Order Specific Question:   Supervising Provider  Answer:   Janora Norlander [[id number]]    Manuel Nearing PA-C [place] [phone]

## 2018-04-13 ENCOUNTER — Other Ambulatory Visit: Payer: Self-pay | Admitting: Physician Assistant

## 2018-04-13 DIAGNOSIS — F32A Depression, unspecified: Secondary | ICD-10-CM

## 2018-04-13 DIAGNOSIS — F419 Anxiety disorder, unspecified: Secondary | ICD-10-CM

## 2018-04-13 DIAGNOSIS — F329 Major depressive disorder, single episode, unspecified: Secondary | ICD-10-CM

## 2018-05-05 ENCOUNTER — Other Ambulatory Visit: Payer: Self-pay | Admitting: Physician Assistant

## 2018-05-05 DIAGNOSIS — F329 Major depressive disorder, single episode, unspecified: Secondary | ICD-10-CM

## 2018-05-05 DIAGNOSIS — F32A Depression, unspecified: Secondary | ICD-10-CM

## 2018-05-11 ENCOUNTER — Ambulatory Visit (INDEPENDENT_AMBULATORY_CARE_PROVIDER_SITE_OTHER): Payer: BLUE CROSS/BLUE SHIELD | Admitting: Physician Assistant

## 2018-05-11 ENCOUNTER — Other Ambulatory Visit: Payer: Self-pay

## 2018-05-11 DIAGNOSIS — M5136 Other intervertebral disc degeneration, lumbar region: Secondary | ICD-10-CM | POA: Diagnosis not present

## 2018-05-11 DIAGNOSIS — F329 Major depressive disorder, single episode, unspecified: Secondary | ICD-10-CM | POA: Diagnosis not present

## 2018-05-11 DIAGNOSIS — F32A Depression, unspecified: Secondary | ICD-10-CM

## 2018-05-11 DIAGNOSIS — F419 Anxiety disorder, unspecified: Secondary | ICD-10-CM

## 2018-05-11 MED ORDER — PAROXETINE HCL 10 MG PO TABS: 10.0000 mg | ORAL_TABLET | Freq: Every day | ORAL | 1 refills | Status: DC

## 2018-05-11 MED ORDER — GABAPENTIN 100 MG PO CAPS: 300.0000 mg | ORAL_CAPSULE | Freq: Two times a day (BID) | ORAL | 3 refills | Status: DC

## 2018-05-11 MED ORDER — BUPROPION HCL ER (XL) 300 MG PO TB24: ORAL_TABLET | ORAL | 1 refills | Status: DC

## 2018-05-15 ENCOUNTER — Encounter: Payer: Self-pay | Admitting: Physician Assistant

## 2018-05-15 ENCOUNTER — Ambulatory Visit: Payer: BLUE CROSS/BLUE SHIELD | Admitting: Physician Assistant

## 2018-05-15 NOTE — Progress Notes (Signed)
Telephone visit  Subjective: [id number] disc disease, depression and anxiety.  PCP: Terald Sleeper, PA-C WCB:[name] is a 53 y.o. male calls for telephone consult today. Patient provides verbal consent for consult held via phone.  Patient is identified with 2 separate identifiers.  At this time the entire area is on COVID-19 social distancing and stay home orders are in place.  Patient is of higher risk and therefore we are performing this by a virtual method.  Location of patient: home Location of provider: HOME Others present for call: No  Patient is having a periodic recheck on his depression.  He is also having degenerative disc disease.  We had started the gabapentin and he is doing well with that and seems to think he has had some improvement in his chronic pain.  We have discussed the need to continue to titrate this up as as he can tolerate it.  He feels that his depression anxiety is doing much better on the Wellbutrin with the Paxil.  Refills are going to be sent for this.  And we will plan to recheck him in 1 to 2 months.   ROS: Per HPI  Allergies  Allergen Reactions  . Fish Allergy Shortness Of Breath and Swelling   Past Medical History:  Diagnosis Date  . Anxiety   . Arthritis   . Depression   . Hx of adenomatous polyp of colon 04/10/2014    Current Outpatient Medications:  .  acetaminophen (TYLENOL) 500 MG tablet, Take 1,000 mg by mouth every 6 (six) hours as needed., Disp: , Rfl:  .  buPROPion (WELLBUTRIN XL) 300 MG 24 hr tablet, One tab po QAM, Disp: 90 tablet, Rfl: 1 .  diphenhydrAMINE (BENADRYL) 25 MG tablet, Take 25 mg by mouth every 6 (six) hours as needed., Disp: , Rfl:  .  gabapentin (NEURONTIN) 100 MG capsule, Take 3 capsules (300 mg total) by mouth 2 (two) times daily., Disp: 180 capsule, Rfl: 3 .  ibuprofen (ADVIL,MOTRIN) 200 MG tablet, Take 200 mg by mouth every 6 (six) hours as needed., Disp: , Rfl:  .  Omega-3 Fatty Acids (FISH OIL) 1200  MG CAPS, Take by mouth., Disp: , Rfl:  .  PARoxetine (PAXIL) 10 MG tablet, Take 1-2 tablets (10-20 mg total) by mouth daily., Disp: 180 tablet, Rfl: 1  Assessment/ Plan: 53 y.o. male   1. DDD (degenerative disc disease), lumbar - gabapentin (NEURONTIN) 100 MG capsule; Take 3 capsules (300 mg total) by mouth 2 (two) times daily.  Dispense: 180 capsule; Refill: 3  2. Depression, unspecified depression type - buPROPion (WELLBUTRIN XL) 300 MG 24 hr tablet; One tab po QAM  Dispense: 90 tablet; Refill: 1 - PARoxetine (PAXIL) 10 MG tablet; Take 1-2 tablets (10-20 mg total) by mouth daily.  Dispense: 180 tablet; Refill: 1  3. Anxiety - PARoxetine (PAXIL) 10 MG tablet; Take 1-2 tablets (10-20 mg total) by mouth daily.  Dispense: 180 tablet; Refill: 1   Start time: 4:05 PM End time: 4:18 p.m.  Meds ordered this encounter  Medications  . gabapentin (NEURONTIN) 100 MG capsule    Sig: Take 3 capsules (300 mg total) by mouth 2 (two) times daily.    Dispense:  180 capsule    Refill:  3    Keep on file until patient calls    Order Specific Question:   Supervising Provider    Answer:   Janora Norlander [[id number]]  . buPROPion (WELLBUTRIN XL) 300 MG 24 hr  tablet    Sig: One tab po QAM    Dispense:  90 tablet    Refill:  1    Order Specific Question:   Supervising Provider    Answer:   Janora Norlander [[id number]]  . PARoxetine (PAXIL) 10 MG tablet    Sig: Take 1-2 tablets (10-20 mg total) by mouth daily.    Dispense:  180 tablet    Refill:  1    Order Specific Question:   Supervising Provider    Answer:   Janora Norlander [[id number]]    Particia Nearing PA-C [place] [phone]

## 2018-05-26 ENCOUNTER — Telehealth: Payer: Self-pay | Admitting: Physician Assistant

## 2018-06-20 ENCOUNTER — Other Ambulatory Visit: Payer: Self-pay | Admitting: Family Medicine

## 2018-06-20 DIAGNOSIS — M5136 Other intervertebral disc degeneration, lumbar region: Secondary | ICD-10-CM

## 2018-07-13 ENCOUNTER — Other Ambulatory Visit: Payer: Self-pay | Admitting: Physician Assistant

## 2018-07-13 DIAGNOSIS — M5136 Other intervertebral disc degeneration, lumbar region: Secondary | ICD-10-CM

## 2018-11-23 ENCOUNTER — Other Ambulatory Visit: Payer: Self-pay | Admitting: Physician Assistant

## 2018-11-23 DIAGNOSIS — F32A Depression, unspecified: Secondary | ICD-10-CM

## 2018-11-23 DIAGNOSIS — F329 Major depressive disorder, single episode, unspecified: Secondary | ICD-10-CM

## 2018-11-28 ENCOUNTER — Other Ambulatory Visit: Payer: Self-pay | Admitting: Physician Assistant

## 2018-11-28 DIAGNOSIS — F419 Anxiety disorder, unspecified: Secondary | ICD-10-CM

## 2018-11-28 DIAGNOSIS — F329 Major depressive disorder, single episode, unspecified: Secondary | ICD-10-CM

## 2018-11-28 DIAGNOSIS — F32A Depression, unspecified: Secondary | ICD-10-CM

## 2018-12-06 ENCOUNTER — Other Ambulatory Visit: Payer: Self-pay | Admitting: Physician Assistant

## 2018-12-06 DIAGNOSIS — F32A Depression, unspecified: Secondary | ICD-10-CM

## 2018-12-06 DIAGNOSIS — F329 Major depressive disorder, single episode, unspecified: Secondary | ICD-10-CM

## 2018-12-06 DIAGNOSIS — F419 Anxiety disorder, unspecified: Secondary | ICD-10-CM

## 2018-12-20 ENCOUNTER — Other Ambulatory Visit: Payer: Self-pay | Admitting: Physician Assistant

## 2018-12-20 DIAGNOSIS — F329 Major depressive disorder, single episode, unspecified: Secondary | ICD-10-CM

## 2018-12-20 DIAGNOSIS — F32A Depression, unspecified: Secondary | ICD-10-CM

## 2019-01-31 ENCOUNTER — Other Ambulatory Visit: Payer: Self-pay | Admitting: Physician Assistant

## 2019-01-31 DIAGNOSIS — F32A Depression, unspecified: Secondary | ICD-10-CM

## 2019-01-31 DIAGNOSIS — F329 Major depressive disorder, single episode, unspecified: Secondary | ICD-10-CM

## 2019-01-31 MED ORDER — BUPROPION HCL ER (XL) 300 MG PO TB24: ORAL_TABLET | ORAL | 0 refills | Status: DC

## 2019-01-31 NOTE — Telephone Encounter (Signed)
Patient aware rx sent  

## 2019-02-07 ENCOUNTER — Ambulatory Visit (INDEPENDENT_AMBULATORY_CARE_PROVIDER_SITE_OTHER): Payer: BC Managed Care – PPO | Admitting: Physician Assistant

## 2019-02-07 DIAGNOSIS — M5136 Other intervertebral disc degeneration, lumbar region: Secondary | ICD-10-CM

## 2019-02-07 DIAGNOSIS — Z0000 Encounter for general adult medical examination without abnormal findings: Secondary | ICD-10-CM

## 2019-02-07 DIAGNOSIS — M542 Cervicalgia: Secondary | ICD-10-CM

## 2019-02-07 DIAGNOSIS — F329 Major depressive disorder, single episode, unspecified: Secondary | ICD-10-CM

## 2019-02-07 DIAGNOSIS — F419 Anxiety disorder, unspecified: Secondary | ICD-10-CM | POA: Diagnosis not present

## 2019-02-07 DIAGNOSIS — F32A Depression, unspecified: Secondary | ICD-10-CM

## 2019-02-07 DIAGNOSIS — R350 Frequency of micturition: Secondary | ICD-10-CM

## 2019-02-07 DIAGNOSIS — N401 Enlarged prostate with lower urinary tract symptoms: Secondary | ICD-10-CM

## 2019-02-07 MED ORDER — BUPROPION HCL ER (XL) 300 MG PO TB24: ORAL_TABLET | ORAL | 5 refills | Status: DC

## 2019-02-07 MED ORDER — GABAPENTIN 100 MG PO CAPS: 300.0000 mg | ORAL_CAPSULE | Freq: Two times a day (BID) | ORAL | 2 refills | Status: DC

## 2019-02-07 MED ORDER — TAMSULOSIN HCL 0.4 MG PO CAPS: 0.4000 mg | ORAL_CAPSULE | Freq: Every day | ORAL | 11 refills | Status: DC

## 2019-02-07 MED ORDER — ALPRAZOLAM 0.5 MG PO TABS: 0.2500 mg | ORAL_TABLET | Freq: Two times a day (BID) | ORAL | 1 refills | Status: DC | PRN

## 2019-02-07 NOTE — Progress Notes (Addendum)
Telephone visit  Subjective: CC: Recheck on chronic medical conditions PCP: Terald Sleeper, PA-C OFH:[id number] R Cr[place] is a 54 y.o. male calls for telephone consult today. Patient provides verbal consent for consult held via phone.  Patient is identified with 2 separate identifiers.  At this time the entire area is on COVID-19 social distancing and stay home orders are in place.  Patient is of higher risk and therefore we are performing this by a virtual method.  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  The patient also does need annual lab work.  An order will be placed and he will come in Next convenience.  He is experiencing difficulty with starting and stopping.  He will sometimes have nocturia at this point.  He denies any pain or change in the urine.  He continues with cervical pain that where he is known establish degenerative disc disease with nerve root impingement.  Gabapentin has done very well for him and has helped him have a great reduction in his pain.  He does need refills on this medication.  He reports his depression anxiety are fairly stable even with everything that is going on.  His medications are reviewed.  And refills will be sent.   ROS: Per HPI  Allergies  Allergen Reactions  . Fish Allergy Shortness Of Breath and Swelling   Past Medical History:  Diagnosis Date  . Anxiety   . Arthritis   . Depression   . Hx of adenomatous polyp of colon 04/10/2014    Current Outpatient Medications:  .  acetaminophen (TYLENOL) 500 MG tablet, Take 1,000 mg by mouth every 6 (six) hours as needed., Disp: , Rfl:  .  diphenhydrAMINE (BENADRYL) 25 MG tablet, Take 25 mg by mouth  every 6 (six) hours as needed., Disp: , Rfl:  .  ibuprofen (ADVIL,MOTRIN) 200 MG tablet, Take 200 mg by mouth every 6 (six) hours as needed., Disp: , Rfl:  .  Omega-3 Fatty Acids (FISH OIL) 1200 MG CAPS, Take by mouth., Disp: , Rfl:  .  PARoxetine (PAXIL) 10 MG tablet, Take 1-2 tablets (10-20 mg total) by mouth daily. Needs to be seen for further refills., Disp: 60 tablet, Rfl: 0 .  ALPRAZolam (XANAX) 0.5 MG tablet, Take 0.5-1 tablets (0.25-0.5 mg total) by mouth 2 (two) times daily as needed for anxiety., Disp: 30 tablet, Rfl: 1 .  buPROPion (WELLBUTRIN XL) 300 MG 24 hr tablet, TAKE 1 TABLET BY MOUTH EVERY DAY IN THE MORNING (Needs to be seen before next refill), Disp: 30 tablet, Rfl: 5 .  gabapentin (NEURONTIN) 100 MG capsule, Take 3 capsules (300 mg total) by mouth 2 (two) times daily., Disp: 540 capsule, Rfl: 2 .  tamsulosin (FLOMAX) 0.4 MG CAPS capsule, Take 1 capsule (0.4 mg total) by mouth daily., Disp: 30 capsule, Rfl: 11  Assessment/ Plan: 54 y.o. male   1. Depression, unspecified depression type - buPROPion (WELLBUTRIN XL) 300 MG 24 hr tablet; TAKE 1 TABLET BY MOUTH EVERY DAY IN THE MORNING (Needs to be seen before next refill)  Dispense: 30 tablet; Refill: 5  2. DDD (degenerative disc disease), lumbar - gabapentin (NEURONTIN) 100 MG capsule; Take 3 capsules (300 mg total) by mouth 2 (two) times  daily.  Dispense: 540 capsule; Refill: 2  3. Anxiety - ALPRAZolam (XANAX) 0.5 MG tablet; Take 0.5-1 tablets (0.25-0.5 mg total) by mouth 2 (two) times daily as needed for anxiety.  Dispense: 30 tablet; Refill: 1  4. Cervical pain - gabapentin (NEURONTIN) 100 MG capsule; Take 3 capsules (300 mg total) by mouth 2 (two) times daily.  Dispense: 540 capsule; Refill: 2  5. Well adult exam - CBC with Differential/Platelet; Future - CMP14+EGFR; Future - Lipid panel; Future - TSH; Future - PSA; Future  6. Benign prostatic hyperplasia with urinary frequency - PSA; Future - tamsulosin (FLOMAX)  0.4 MG CAPS capsule; Take 1 capsule (0.4 mg total) by mouth daily.  Dispense: 30 capsule; Refill: 11   No follow-ups on file.  Continue all other maintenance medications as listed above.  Start time: 10:33 AM End time: 10:53 AM  Meds ordered this encounter  Medications  . buPROPion (WELLBUTRIN XL) 300 MG 24 hr tablet    Sig: TAKE 1 TABLET BY MOUTH EVERY DAY IN THE MORNING (Needs to be seen before next refill)    Dispense:  30 tablet    Refill:  5    Order Specific Question:   Supervising Provider    Answer:   Janora Norlander [[id number]]  . gabapentin (NEURONTIN) 100 MG capsule    Sig: Take 3 capsules (300 mg total) by mouth 2 (two) times daily.    Dispense:  540 capsule    Refill:  2    Order Specific Question:   Supervising Provider    Answer:   Janora Norlander [[id number]]  . ALPRAZolam (XANAX) 0.5 MG tablet    Sig: Take 0.5-1 tablets (0.25-0.5 mg total) by mouth 2 (two) times daily as needed for anxiety.    Dispense:  30 tablet    Refill:  1    Order Specific Question:   Supervising Provider    Answer:   Janora Norlander [[id number]]  . tamsulosin (FLOMAX) 0.4 MG CAPS capsule    Sig: Take 1 capsule (0.4 mg total) by mouth daily.    Dispense:  30 capsule    Refill:  11    Order Specific Question:   Supervising Provider    Answer:   Janora Norlander [[id number]]    Particia Nearing PA-C [place] [phone]

## 2019-02-11 ENCOUNTER — Encounter: Payer: Self-pay | Admitting: Physician Assistant

## 2019-02-21 ENCOUNTER — Other Ambulatory Visit: Payer: Self-pay

## 2019-02-21 ENCOUNTER — Other Ambulatory Visit: Payer: BC Managed Care – PPO

## 2019-02-21 DIAGNOSIS — Z0000 Encounter for general adult medical examination without abnormal findings: Secondary | ICD-10-CM

## 2019-02-21 DIAGNOSIS — R350 Frequency of micturition: Secondary | ICD-10-CM

## 2019-02-21 DIAGNOSIS — N401 Enlarged prostate with lower urinary tract symptoms: Secondary | ICD-10-CM

## 2019-02-22 LAB — CMP14+EGFR
ALT: 26 IU/L (ref 0–44)
AST: 20 IU/L (ref 0–40)
Albumin/Globulin Ratio: 1.8 (ref 1.2–2.2)
Albumin: 4.6 g/dL (ref 3.8–4.9)
Alkaline Phosphatase: 54 IU/L (ref 39–117)
BUN/Creatinine Ratio: 11 (ref 9–20)
BUN: 10 mg/dL (ref 6–24)
Bilirubin Total: 0.6 mg/dL (ref 0.0–1.2)
CO2: 23 mmol/L (ref 20–29)
Calcium: 9.4 mg/dL (ref 8.7–10.2)
Chloride: 102 mmol/L (ref 96–106)
Creatinine, Ser: 0.94 mg/dL (ref 0.76–1.27)
GFR calc Af Amer: 105 mL/min/{1.73_m2} (ref >59)
GFR calc non Af Amer: 91 mL/min/{1.73_m2} (ref >59)
Globulin, Total: 2.6 g/dL (ref 1.5–4.5)
Glucose: 93 mg/dL (ref 65–99)
Potassium: 4.1 mmol/L (ref 3.5–5.2)
Sodium: 140 mmol/L (ref 134–144)
Total Protein: 7.2 g/dL (ref 6.0–8.5)

## 2019-02-22 LAB — PSA: Prostate Specific Ag, Serum: 1 ng/mL (ref 0.0–4.0)

## 2019-02-22 LAB — CBC WITH DIFFERENTIAL/PLATELET
Basophils Absolute: 0.1 10*3/uL (ref 0.0–0.2)
Basos: 1 %
EOS (ABSOLUTE): 0.5 10*3/uL — ABNORMAL HIGH (ref 0.0–0.4)
Eos: 7 %
Hematocrit: 45.7 % (ref 37.5–51.0)
Hemoglobin: 15.5 g/dL (ref 13.0–17.7)
Immature Grans (Abs): 0 10*3/uL (ref 0.0–0.1)
Immature Granulocytes: 0 %
Lymphocytes Absolute: 2.3 10*3/uL (ref 0.7–3.1)
Lymphs: 34 %
MCH: 30.1 pg (ref 26.6–33.0)
MCHC: 33.9 g/dL (ref 31.5–35.7)
MCV: 89 fL (ref 79–97)
Monocytes Absolute: 0.4 10*3/uL (ref 0.1–0.9)
Monocytes: 6 %
Neutrophils Absolute: 3.5 10*3/uL (ref 1.4–7.0)
Neutrophils: 52 %
Platelets: 215 10*3/uL (ref 150–450)
RBC: 5.15 x10E6/uL (ref 4.14–5.80)
RDW: 12.7 % (ref 11.6–15.4)
WBC: 6.8 10*3/uL (ref 3.4–10.8)

## 2019-02-22 LAB — LIPID PANEL
Chol/HDL Ratio: 5 ratio (ref 0.0–5.0)
Cholesterol, Total: 203 mg/dL — ABNORMAL HIGH (ref 100–199)
HDL: 41 mg/dL (ref >39)
LDL Chol Calc (NIH): 137 mg/dL — ABNORMAL HIGH (ref 0–99)
Triglycerides: 140 mg/dL (ref 0–149)
VLDL Cholesterol Cal: 25 mg/dL (ref 5–40)

## 2019-02-22 LAB — TSH: TSH: 2.26 u[IU]/mL (ref 0.450–4.500)

## 2019-03-23 ENCOUNTER — Encounter: Payer: Self-pay | Admitting: Internal Medicine

## 2019-04-16 ENCOUNTER — Other Ambulatory Visit: Payer: Self-pay

## 2019-04-16 ENCOUNTER — Ambulatory Visit (AMBULATORY_SURGERY_CENTER): Payer: Self-pay | Admitting: *Deleted

## 2019-04-16 VITALS — Temp 97.3°F | Ht 75.0 in | Wt 250.0 lb

## 2019-04-16 DIAGNOSIS — Z800 Family history of malignant neoplasm of digestive organs: Secondary | ICD-10-CM

## 2019-04-16 DIAGNOSIS — Z86010 Personal history of colonic polyps: Secondary | ICD-10-CM

## 2019-04-16 DIAGNOSIS — Z01818 Encounter for other preprocedural examination: Secondary | ICD-10-CM

## 2019-04-16 NOTE — Progress Notes (Signed)
Patient is here in-person for PV. Patient denies any allergies to eggs or soy. Patient denies any problems with anesthesia/sedation. Patient denies any oxygen use at home. Patient denies taking any diet/weight loss medications or blood thinners. Patient is not being treated for MRSA or C-diff. EMMI education assisgned to the patient for the procedure, this was explained and instructions given to patient. COVID-19 screening test is on 4/7, the pt is aware.  Patient is aware of our care-partner policy and 0000000 safety protocol.

## 2019-04-25 ENCOUNTER — Other Ambulatory Visit: Payer: Self-pay | Admitting: Internal Medicine

## 2019-04-25 ENCOUNTER — Ambulatory Visit (INDEPENDENT_AMBULATORY_CARE_PROVIDER_SITE_OTHER): Payer: BC Managed Care – PPO

## 2019-04-25 DIAGNOSIS — Z1159 Encounter for screening for other viral diseases: Secondary | ICD-10-CM

## 2019-04-26 ENCOUNTER — Encounter: Payer: Self-pay | Admitting: Internal Medicine

## 2019-04-26 LAB — SARS CORONAVIRUS 2 (TAT 6-24 HRS): SARS Coronavirus 2: NEGATIVE

## 2019-04-30 ENCOUNTER — Encounter: Payer: Self-pay | Admitting: Internal Medicine

## 2019-04-30 ENCOUNTER — Other Ambulatory Visit: Payer: Self-pay

## 2019-04-30 ENCOUNTER — Ambulatory Visit (AMBULATORY_SURGERY_CENTER): Payer: BC Managed Care – PPO | Admitting: Internal Medicine

## 2019-04-30 VITALS — BP 111/63 | HR 64 | Temp 97.5°F | Resp 13 | Ht 75.0 in | Wt 250.0 lb

## 2019-04-30 DIAGNOSIS — D125 Benign neoplasm of sigmoid colon: Secondary | ICD-10-CM

## 2019-04-30 DIAGNOSIS — Z86010 Personal history of colonic polyps: Secondary | ICD-10-CM

## 2019-04-30 DIAGNOSIS — D123 Benign neoplasm of transverse colon: Secondary | ICD-10-CM

## 2019-04-30 DIAGNOSIS — Z800 Family history of malignant neoplasm of digestive organs: Secondary | ICD-10-CM

## 2019-04-30 MED ORDER — SODIUM CHLORIDE 0.9 % IV SOLN: 500.0000 mL | Freq: Once | INTRAVENOUS | Status: DC

## 2019-04-30 NOTE — Progress Notes (Signed)
Temp by ADB Vitals by KA   Pt's states no medical or surgical changes since previsit or office visit.

## 2019-04-30 NOTE — Progress Notes (Signed)
Report given to PACU, vss 

## 2019-04-30 NOTE — Progress Notes (Signed)
Called to room to assist during endoscopic procedure.  Patient ID and intended procedure confirmed with present staff. Received instructions for my participation in the procedure from the performing physician.  

## 2019-04-30 NOTE — Op Note (Addendum)
[place] Patient Name: Manuel Galloway Procedure Date: 04/30/2019 1:26 PM MRN: [id number] Endoscopist: Gatha Mayer , MD Age: 54 Referring MD:  Date of Birth: [birth year] Gender: Male Account #: [id number] Procedure:                Colonoscopy Indications:              Surveillance: Personal history of adenomatous                            polyps on last colonoscopy 5 years ago Medicines:                Propofol per Anesthesia, Monitored Anesthesia Care Procedure:                Pre-Anesthesia Assessment:                           - Prior to the procedure, a History and Physical                            was performed, and patient medications and                            allergies were reviewed. The patient's tolerance of                            previous anesthesia was also reviewed. The risks                            and benefits of the procedure and the sedation                            options and risks were discussed with the patient.                            All questions were answered, and informed consent                            was obtained. Prior Anticoagulants: The patient has                            taken no previous anticoagulant or antiplatelet                            agents. ASA Grade Assessment: II - A patient with                            mild systemic disease. After reviewing the risks                            and benefits, the patient was deemed in                            satisfactory condition to undergo the procedure.  After obtaining informed consent, the colonoscope                            was passed under direct vision. Throughout the                            procedure, the patient's blood pressure, pulse, and                            oxygen saturations were monitored continuously. The                            Colonoscope was introduced through the anus and   advanced to the the cecum, identified by                            appendiceal orifice and ileocecal valve. The                            colonoscopy was performed without difficulty. The                            patient tolerated the procedure well. The quality                            of the bowel preparation was good. The ileocecal                            valve, appendiceal orifice, and rectum were                            photographed. Scope In: 1:38:06 PM Scope Out: 1:57:57 PM Scope Withdrawal Time: 0 hours 16 minutes 22 seconds  Total Procedure Duration: 0 hours 19 minutes 51 seconds  Findings:                 The perianal and digital rectal examinations were                            normal. Pertinent negatives include normal prostate                            (size, shape, and consistency).                           Five sessile polyps were found in the sigmoid colon                            and descending colon. The polyps were diminutive in                            size. These polyps were removed with a cold snare.                            Resection and retrieval were complete.  Verification                            of patient identification for the specimen was                            done. Estimated blood loss was minimal.                           A 1 mm polyp was found in the transverse colon. The                            polyp was sessile. The polyp was removed with a                            cold biopsy forceps. Resection and retrieval were                            complete. Verification of patient identification                            for the specimen was done. Estimated blood loss was                            minimal.                           A few small-mouthed diverticula were found in the                            sigmoid colon.                           The exam was otherwise without abnormality on                             direct and retroflexion views. Complications:            No immediate complications. Estimated Blood Loss:     Estimated blood loss was minimal. Impression:               - Five diminutive polyps in the sigmoid colon and                            in the descending colon, removed with a cold snare.                            Resected and retrieved.                           - One 1 mm polyp in the transverse colon, removed                            with a cold biopsy forceps. Resected and retrieved.                           -  Diverticulosis in the sigmoid colon.                           - The examination was otherwise normal on direct                            and retroflexion views.                           - Personal history of colonic polyp 3 mm adenoma                            2106 + FHx CRCA father dx at 12. Recommendation:           - Patient has a contact number available for                            emergencies. The signs and symptoms of potential                            delayed complications were discussed with the                            patient. Return to normal activities tomorrow.                            Written discharge instructions were provided to the                            patient.                           - Resume previous diet.                           - Continue present medications.                           - Repeat colonoscopy is recommended for                            surveillance. The colonoscopy date will be                            determined after pathology results from today's                            exam become available for review. Gatha Mayer, MD 04/30/2019 2:09:50 PM This report has been signed electronically.

## 2019-04-30 NOTE — Patient Instructions (Addendum)
I found and removed 6 very small polyps that all look benign. You also have a condition called diverticulosis - common and not usually a problem. Please read the handout provided.  I appreciate the opportunity to care for you. Gatha Mayer, MD, FACG  YOU HAD AN ENDOSCOPIC PROCEDURE TODAY AT [facility]:   Refer to the procedure report that was given to you for any specific questions about what was found during the examination.  If the procedure report does not answer your questions, please call your gastroenterologist to clarify.  If you requested that your care partner not be given the details of your procedure findings, then the procedure report has been included in a sealed envelope for you to review at your convenience later.  YOU SHOULD EXPECT: Some feelings of bloating in the abdomen. Passage of more gas than usual.  Walking can help get rid of the air that was put into your GI tract during the procedure and reduce the bloating. If you had a lower endoscopy (such as a colonoscopy or flexible sigmoidoscopy) you may notice spotting of blood in your stool or on the toilet paper. If you underwent a bowel prep for your procedure, you may not have a normal bowel movement for a few days.  Please Note:  You might notice some irritation and congestion in your nose or some drainage.  This is from the oxygen used during your procedure.  There is no need for concern and it should clear up in a day or so.  SYMPTOMS TO REPORT IMMEDIATELY:   Following lower endoscopy (colonoscopy or flexible sigmoidoscopy):  Excessive amounts of blood in the stool  Significant tenderness or worsening of abdominal pains  Swelling of the abdomen that is new, acute  Fever of 100F or higher    For urgent or emergent issues, a gastroenterologist can be reached at any hour by calling [phone]. Do not use MyChart messaging for urgent concerns.    DIET:  We do recommend a small meal at first, but  then you may proceed to your regular diet.  Drink plenty of fluids but you should avoid alcoholic beverages for 24 hours.  ACTIVITY:  You should plan to take it easy for the rest of today and you should NOT DRIVE or use heavy machinery until tomorrow (because of the sedation medicines used during the test).    FOLLOW UP: Our staff will call the number listed on your records 48-72 hours following your procedure to check on you and address any questions or concerns that you may have regarding the information given to you following your procedure. If we do not reach you, we will leave a message.  We will attempt to reach you two times.  During this call, we will ask if you have developed any symptoms of COVID 19. If you develop any symptoms (ie: fever, flu-like symptoms, shortness of breath, cough etc.) before then, please call [phone].  If you test positive for Covid 19 in the 2 weeks post procedure, please call and report this information to [place].    If any biopsies were taken you will be contacted by phone or by letter within the next 1-3 weeks.  Please call us at [phone] if you have not heard about the biopsies in 3 weeks.    SIGNATURES/CONFIDENTIALITY: You and/or your care partner have signed paperwork which will be entered into your electronic medical record.  These signatures attest to the fact that that the  information above on your After Visit Summary has been reviewed and is understood.  Full responsibility of the confidentiality of this discharge information lies with you and/or your care-partner.

## 2019-05-02 ENCOUNTER — Telehealth: Payer: Self-pay

## 2019-05-02 NOTE — Telephone Encounter (Signed)
  Follow up Call-  Call back number 04/30/2019  Post procedure Call Back phone  # [phone]  Permission to leave phone message Yes  Some recent data might be hidden     Patient questions:  Do you have a fever, pain , or abdominal swelling? No. Pain Score  0 *  Have you tolerated food without any problems? Yes.    Have you been able to return to your normal activities? Yes.    Do you have any questions about your discharge instructions: Diet   No. Medications  No. Follow up visit  No.  Do you have questions or concerns about your Care? No.  Actions: * If pain score is 4 or above: 1. No action needed, pain <4.Have you developed a fever since your procedure? no  2.   Have you had an respiratory symptoms (SOB or cough) since your procedure? no  3.   Have you tested positive for COVID 19 since your procedure no  4.   Have you had any family members/close contacts diagnosed with the COVID 19 since your procedure?  no   If yes to any of these questions please route to Joylene John, RN and Erenest Rasher, RN

## 2019-05-07 ENCOUNTER — Encounter: Payer: Self-pay | Admitting: Internal Medicine

## 2019-05-07 DIAGNOSIS — Z86010 Personal history of colonic polyps: Secondary | ICD-10-CM

## 2019-06-25 ENCOUNTER — Other Ambulatory Visit: Payer: Self-pay | Admitting: *Deleted

## 2019-06-25 DIAGNOSIS — F32A Depression, unspecified: Secondary | ICD-10-CM

## 2019-06-25 MED ORDER — BUPROPION HCL ER (XL) 300 MG PO TB24: ORAL_TABLET | ORAL | 0 refills | Status: DC

## 2019-07-19 ENCOUNTER — Encounter: Payer: Self-pay | Admitting: Nurse Practitioner

## 2019-07-19 ENCOUNTER — Ambulatory Visit (INDEPENDENT_AMBULATORY_CARE_PROVIDER_SITE_OTHER): Payer: BC Managed Care – PPO | Admitting: Nurse Practitioner

## 2019-07-19 ENCOUNTER — Other Ambulatory Visit: Payer: Self-pay

## 2019-07-19 VITALS — BP 123/78 | HR 91 | Temp 98.5°F | Resp 20 | Ht 75.0 in | Wt 251.0 lb

## 2019-07-19 DIAGNOSIS — M542 Cervicalgia: Secondary | ICD-10-CM

## 2019-07-19 DIAGNOSIS — M5136 Other intervertebral disc degeneration, lumbar region: Secondary | ICD-10-CM | POA: Diagnosis not present

## 2019-07-19 DIAGNOSIS — F32A Depression, unspecified: Secondary | ICD-10-CM

## 2019-07-19 DIAGNOSIS — Z0000 Encounter for general adult medical examination without abnormal findings: Secondary | ICD-10-CM

## 2019-07-19 DIAGNOSIS — F419 Anxiety disorder, unspecified: Secondary | ICD-10-CM

## 2019-07-19 DIAGNOSIS — F329 Major depressive disorder, single episode, unspecified: Secondary | ICD-10-CM

## 2019-07-19 DIAGNOSIS — M1711 Unilateral primary osteoarthritis, right knee: Secondary | ICD-10-CM

## 2019-07-19 DIAGNOSIS — G479 Sleep disorder, unspecified: Secondary | ICD-10-CM

## 2019-07-19 MED ORDER — GABAPENTIN 100 MG PO CAPS: 300.0000 mg | ORAL_CAPSULE | Freq: Two times a day (BID) | ORAL | 2 refills | Status: DC

## 2019-07-19 MED ORDER — ALPRAZOLAM 0.5 MG PO TABS: 0.2500 mg | ORAL_TABLET | Freq: Two times a day (BID) | ORAL | 2 refills | Status: DC | PRN

## 2019-07-19 MED ORDER — BUPROPION HCL ER (XL) 300 MG PO TB24: ORAL_TABLET | ORAL | 1 refills | Status: DC

## 2019-07-19 NOTE — Addendum Note (Signed)
Addended by: Rolena Infante on: 07/19/2019 09:22 AM   Modules accepted: Orders

## 2019-07-19 NOTE — Patient Instructions (Signed)

## 2019-07-19 NOTE — Progress Notes (Signed)
Subjective:    Patient ID: Manuel Galloway, male    DOB: [birth year], 54 y.o.   MRN: [id number]   Chief Complaint: Medical Management of Chronic Issues (Establish care   Headache every day)    HPI:  1. Primary osteoarthritis of right knee Still bothers him some. He has had bil knee replacement  2. Anxiety GAD 7 : Generalized Anxiety Score 07/19/2019  Nervous, Anxious, on Edge 3  Control/stop worrying 3  Worry too much - different things 3  Trouble relaxing 3  Restless 0  Easily annoyed or irritable 3  Afraid - awful might happen 3  Total GAD 7 Score 18  Anxiety Difficulty Somewhat difficult  has a new position at [employer] and it is very stressful. He is on combination of wellbutrin and xanax   3. Difficulty sleeping Says he has been sleeping well lately  4. DDD (degenerative disc disease), lumbar Has constant pain. Saw [facility] in past and they recommended surgery. He did not want to have surgery at that time.   5. Cervical pain This has gotten much worse over the last few years. Rates pain 6/10 steady. Has some numbness in right hand when he is laying down.     Outpatient Encounter Medications as of 07/19/2019  Medication Sig  . acetaminophen (TYLENOL) 500 MG tablet Take 1,000 mg by mouth every 6 (six) hours as needed.  . ALPRAZolam (XANAX) 0.5 MG tablet Take 0.5-1 tablets (0.25-0.5 mg total) by mouth 2 (two) times daily as needed for anxiety.  Marland Kitchen buPROPion (WELLBUTRIN XL) 300 MG 24 hr tablet TAKE 1 TABLET BY MOUTH EVERY DAY IN THE MORNING (Needs to be seen before next refill)  . diphenhydrAMINE (BENADRYL) 25 MG tablet Take 25 mg by mouth every 6 (six) hours as needed.  . gabapentin (NEURONTIN) 100 MG capsule Take 3 capsules (300 mg total) by mouth 2 (two) times daily.  Marland Kitchen ibuprofen (ADVIL,MOTRIN) 200 MG tablet Take 200 mg by mouth every 6 (six) hours as needed.  . Multiple Vitamin (MULTIVITAMIN ADULT PO) Take by mouth.  . Omega-3 Fatty Acids (FISH OIL) 1200 MG CAPS Take  by mouth.  . tamsulosin (FLOMAX) 0.4 MG CAPS capsule Take 1 capsule (0.4 mg total) by mouth daily.   No facility-administered encounter medications on file as of 07/19/2019.    Past Surgical History:  Procedure Laterality Date  . COLONOSCOPY    . KNEE ARTHROSCOPY  1986 and 2007   left  . KNEE ARTHROSCOPY  1999   right  . MOUTH SURGERY     wisdom tooth removed   . NASAL SEPTUM SURGERY  2005  . REPLACEMENT TOTAL KNEE Left   . TOTAL KNEE ARTHROPLASTY  07/14/2011   Procedure: TOTAL KNEE ARTHROPLASTY;  Surgeon: Gearlean Alf, MD;  Location: [facility];  Service: Orthopedics;  Laterality: Left;  . TOTAL KNEE ARTHROPLASTY Right 07/16/2013   Procedure: RIGHT TOTAL KNEE ARTHROPLASTY;  Surgeon: Gearlean Alf, MD;  Location: [facility];  Service: Orthopedics;  Laterality: Right;  . UPPER GASTROINTESTINAL ENDOSCOPY    . UPPER GI ENDOSCOPY      Family History  Problem Relation Age of Onset  . Cancer Father   . Colon cancer Father 21  . Colon polyps Neg Hx   . Esophageal cancer Neg Hx   . Rectal cancer Neg Hx   . Stomach cancer Neg Hx     New complaints: None other then stated above  Social history: Lives with wife and daughter  Controlled substance contract: signed 07/19/2019    Review of Systems  Constitutional: Negative.   HENT: Negative.   Eyes: Negative.   Respiratory: Negative.   Cardiovascular: Negative.   Gastrointestinal: Negative.   Endocrine: Negative.   Genitourinary: Negative.   Musculoskeletal: Positive for back pain and neck pain.  Skin: Negative.   Allergic/Immunologic: Negative.   Neurological: Negative.   Hematological: Negative.   Psychiatric/Behavioral: Negative for sleep disturbance. The patient is nervous/anxious.   All other systems reviewed and are negative.      Objective:   Physical Exam Constitutional:      Appearance: Normal appearance.  HENT:     Head: Normocephalic.     Right Ear: Tympanic membrane, ear canal and external ear normal.      Left Ear: Tympanic membrane, ear canal and external ear normal.     Nose: Nose normal.     Mouth/Throat:     Mouth: Mucous membranes are moist.     Pharynx: Oropharynx is clear.  Eyes:     Extraocular Movements: Extraocular movements intact.     Conjunctiva/sclera: Conjunctivae normal.     Pupils: Pupils are equal, round, and reactive to light.  Cardiovascular:     Rate and Rhythm: Normal rate and regular rhythm.     Pulses: Normal pulses.     Heart sounds: Normal heart sounds.  Pulmonary:     Effort: Pulmonary effort is normal.     Breath sounds: Normal breath sounds.  Abdominal:     General: Bowel sounds are normal.     Palpations: Abdomen is soft.  Musculoskeletal:     Cervical back: Normal range of motion and neck supple.  Skin:    General: Skin is warm and dry.     Capillary Refill: Capillary refill takes less than 2 seconds.  Neurological:     Mental Status: He is alert and oriented to person, place, and time. Mental status is at baseline.  Psychiatric:        Mood and Affect: Mood normal.        Behavior: Behavior normal.        Thought Content: Thought content normal.        Judgment: Judgment normal.     BP 123/78   Pulse 91   Temp 98.5 F (36.9 C) (Temporal)   Resp 20   Ht 6\' 3"  (1.905 m)   Wt 251 lb (113.9 kg)   SpO2 94%   BMI 31.37 kg/m        Assessment & Plan:  Manuel Galloway comes in today with chief complaint of Medical Management of Chronic Issues (Establish care   Headache every day)   Diagnosis and orders addressed:  1. Primary osteoarthritis of right knee exercise  2. Anxiety Stress management - ALPRAZolam (XANAX) 0.5 MG tablet; Take 0.5-1 tablets (0.25-0.5 mg total) by mouth 2 (two) times daily as needed for anxiety.  Dispense: 30 tablet; Refill: 2  3. Difficulty sleeping  4. DDD (degenerative disc disease), lumbar referraltoortho - Ambulatory referral to Orthopedic Surgery - gabapentin (NEURONTIN) 100 MG capsule; Take 3 capsules  (300 mg total) by mouth 2 (two) times daily.  Dispense: 540 capsule; Refill: 2  5. Cervical pain - Ambulatory referral to Orthopedic Surgery - gabapentin (NEURONTIN) 100 MG capsule; Take 3 capsules (300 mg total) by mouth 2 (two) times daily.  Dispense: 540 capsule; Refill: 2  6. Depression, unspecified depression type Stress management - buPROPion (WELLBUTRIN XL) 300 MG 24 hr tablet; TAKE  1 TABLET BY MOUTH EVERY DAY IN THE MORNING (Needs to be seen before next refill)  Dispense: 90 tablet; Refill: 1   Labs pending Health Maintenance reviewed Diet and exercise encouraged  Follow up plan: 3 months   Mary-Margaret Hassell Done, FNP

## 2019-07-20 LAB — CBC WITH DIFFERENTIAL/PLATELET
Basophils Absolute: 0.1 10*3/uL (ref 0.0–0.2)
Basos: 1 %
EOS (ABSOLUTE): 0.3 10*3/uL (ref 0.0–0.4)
Eos: 4 %
Hematocrit: 43 % (ref 37.5–51.0)
Hemoglobin: 14.8 g/dL (ref 13.0–17.7)
Immature Grans (Abs): 0 10*3/uL (ref 0.0–0.1)
Immature Granulocytes: 0 %
Lymphocytes Absolute: 2.4 10*3/uL (ref 0.7–3.1)
Lymphs: 37 %
MCH: 29.8 pg (ref 26.6–33.0)
MCHC: 34.4 g/dL (ref 31.5–35.7)
MCV: 87 fL (ref 79–97)
Monocytes Absolute: 0.4 10*3/uL (ref 0.1–0.9)
Monocytes: 7 %
Neutrophils Absolute: 3.2 10*3/uL (ref 1.4–7.0)
Neutrophils: 51 %
Platelets: 199 10*3/uL (ref 150–450)
RBC: 4.97 x10E6/uL (ref 4.14–5.80)
RDW: 12.5 % (ref 11.6–15.4)
WBC: 6.3 10*3/uL (ref 3.4–10.8)

## 2019-07-20 LAB — CMP14+EGFR
ALT: 18 IU/L (ref 0–44)
AST: 19 IU/L (ref 0–40)
Albumin/Globulin Ratio: 1.7 (ref 1.2–2.2)
Albumin: 4.5 g/dL (ref 3.8–4.9)
Alkaline Phosphatase: 55 IU/L (ref 48–121)
BUN/Creatinine Ratio: 13 (ref 9–20)
BUN: 12 mg/dL (ref 6–24)
Bilirubin Total: 0.6 mg/dL (ref 0.0–1.2)
CO2: 21 mmol/L (ref 20–29)
Calcium: 9.5 mg/dL (ref 8.7–10.2)
Chloride: 106 mmol/L (ref 96–106)
Creatinine, Ser: 0.94 mg/dL (ref 0.76–1.27)
GFR calc Af Amer: 104 mL/min/{1.73_m2} (ref >59)
GFR calc non Af Amer: 90 mL/min/{1.73_m2} (ref >59)
Globulin, Total: 2.7 g/dL (ref 1.5–4.5)
Glucose: 113 mg/dL — ABNORMAL HIGH (ref 65–99)
Potassium: 4.4 mmol/L (ref 3.5–5.2)
Sodium: 140 mmol/L (ref 134–144)
Total Protein: 7.2 g/dL (ref 6.0–8.5)

## 2019-07-20 LAB — LIPID PANEL
Chol/HDL Ratio: 4.5 ratio (ref 0.0–5.0)
Cholesterol, Total: 208 mg/dL — ABNORMAL HIGH (ref 100–199)
HDL: 46 mg/dL (ref >39)
LDL Chol Calc (NIH): 136 mg/dL — ABNORMAL HIGH (ref 0–99)
Triglycerides: 145 mg/dL (ref 0–149)
VLDL Cholesterol Cal: 26 mg/dL (ref 5–40)

## 2019-08-21 ENCOUNTER — Ambulatory Visit: Payer: BC Managed Care – PPO | Attending: Orthopedic Surgery | Admitting: Physical Therapy

## 2019-08-21 ENCOUNTER — Other Ambulatory Visit: Payer: Self-pay

## 2019-08-21 DIAGNOSIS — M542 Cervicalgia: Secondary | ICD-10-CM | POA: Insufficient documentation

## 2019-08-21 NOTE — Therapy (Addendum)
[facility] [place], [place], [address] Phone: [phone]   Fax:  [phone]  Physical Therapy Evaluation  Patient Details  Name: Manuel Galloway MRN: [id number] Date of Birth: 1965/06/22 Referring Provider (PT): [facility] PA-C.   Encounter Date: 08/21/2019   PT End of Session - 08/21/19 0953    Visit Number 1    Number of Visits 12    Date for PT Re-Evaluation 09/18/19    Authorization Type FOTO.    PT Start Time 0815    PT Stop Time 0914    PT Time Calculation (min) 59 min    Activity Tolerance Patient tolerated treatment well    Behavior During Therapy [facility] for tasks assessed/performed           Past Medical History:  Diagnosis Date  . Anxiety   . Arthritis   . Depression   . Hx of adenomatous polyp of colon 04/10/2014    Past Surgical History:  Procedure Laterality Date  . COLONOSCOPY    . KNEE ARTHROSCOPY  1986 and 2007   left  . KNEE ARTHROSCOPY  1999   right  . MOUTH SURGERY     wisdom tooth removed   . NASAL SEPTUM SURGERY  2005  . REPLACEMENT TOTAL KNEE Left   . TOTAL KNEE ARTHROPLASTY  07/14/2011   Procedure: TOTAL KNEE ARTHROPLASTY;  Surgeon: Gearlean Alf, MD;  Location: [facility];  Service: Orthopedics;  Laterality: Left;  . TOTAL KNEE ARTHROPLASTY Right 07/16/2013   Procedure: RIGHT TOTAL KNEE ARTHROPLASTY;  Surgeon: Gearlean Alf, MD;  Location: [facility];  Service: Orthopedics;  Laterality: Right;  . UPPER GASTROINTESTINAL ENDOSCOPY    . UPPER GI ENDOSCOPY      There were no vitals filed for this visit.    Subjective Assessment - 08/21/19 0928    Subjective COVID-19 screen performed prior to patient entering clinic.  The patient presents to the clinic today with c/o chronic neck pain.  His pain-level is a 2/10 and up to 7-8/10 at times.  Movement increases his pain and rest decrease pain.  He occasional will experince a dull headache.    Pertinent History Bilateral TKA's, arthritis.     Patient Stated Goals Get out of pain.    Currently in Pain? Yes    Pain Score 2     Pain Location Neck    Pain Orientation Left    Pain Descriptors / Indicators Aching   "Pulling."   Pain Type Chronic pain    Pain Onset More than a month ago    Pain Frequency Constant    Aggravating Factors  See above.    Pain Relieving Factors See above.              [facility] PT Assessment - 08/21/19 0001      Assessment   Medical Diagnosis Cervicalgia.    Referring Provider (PT) Estill Bamberg Ward PA-C.    Onset Date/Surgical Date --   ~3 years.     Precautions   Precautions None      Restrictions   Weight Bearing Restrictions No      Balance Screen   Has the patient fallen in the past 6 months No    Has the patient had a decrease in activity level because of a fear of falling?  No    Is the patient reluctant to leave their home because of a fear of falling?  No      Home Environment  Living Environment Private residence      Observation/Other Assessments   Focus on Therapeutic Outcomes (FOTO)  64% limitation.      Posture/Postural Control   Posture/Postural Control Postural limitations    Postural Limitations Rounded Shoulders;Forward head      Deep Tendon Reflexes   DTR Assessment Site Biceps;Brachioradialis;Triceps    Biceps DTR 2+    Brachioradialis DTR 2+    Triceps DTR 2+      ROM / Strength   AROM / PROM / Strength AROM;Strength      AROM   Overall AROM Comments Active left cervical rotation= 47 degrees and right= 63 degrees.      Strength   Overall Strength Comments Normal UE strength.      Palpation   Palpation comment Tender to palpation on left at C5-6 and tender at mid portion of patient's left UT.                      Objective measurements completed on examination: See above findings.       [facility] Adult PT Treatment/Exercise - 08/21/19 0001      Modalities   Modalities Electrical Stimulation;Traction      Civil engineer, contracting Location left C-spine/UT    Electrical Stimulation Action Pre-mod at 80-150 Hz x 10 minutes (5 sec on and 5 sec off)    Electrical Stimulation Goals Pain      Traction   Type of Traction Cervical    Min (lbs) 5    Max (lbs) 15    Hold Time 99    Rest Time 5    Time 15                  PT Education - 08/21/19 0954    Education Details Chin tucks and cervical extension.    Person(s) Educated Patient    Methods Explanation    Comprehension Verbalized understanding               PT Long Term Goals - 08/21/19 1022      PT LONG TERM GOAL #1   Title Independent with a HEP.    Time 4    Period Weeks    Status New      PT LONG TERM GOAL #2   Title Increase active cervical rotation to 70 degrees+ so patient can turn head more easily while driving.    Time 4    Period Weeks    Status New      PT LONG TERM GOAL #3   Title Perform ADL's with pain not > 3/10.    Time 4    Period Weeks    Status New                  Plan - 08/21/19 1014    Clinical Impression Statement The patient presents to [facility] with c/o chronic left sided neck pain.  He has limitations of cervical range of motion.  He is tender to palpation over his left cervical region and left UT.  His UE DTR's are intact.  His FOTO limitation score is 64%.  Patient will benefit from skilled physical therapy intervention to address deficits and pain.    Personal Factors and Comorbidities Comorbidity 1;Comorbidity 2    Comorbidities Bilateral TKA's, arthritis.    Examination-Activity Limitations Other    Examination-Participation Restrictions Other    Stability/Clinical Decision Making Evolving/Moderate complexity    Clinical Decision Making  Low    Rehab Potential Excellent    PT Frequency 3x / week    PT Duration 4 weeks    PT Treatment/Interventions ADLs/Self Care Home Management;Cryotherapy;Electrical Stimulation;Ultrasound;Traction;Moist Heat;Therapeutic activities;Therapeutic  exercise;Manual techniques;Patient/family education;Passive range of motion;Dry needling;Spinal Manipulations    PT Next Visit Plan Int cervical traction at 18# (max 23#); combo e'stim/US to left C-spine musculature/UT, STW/M, postural exercises.    Consulted and Agree with Plan of Care Patient           Patient will benefit from skilled therapeutic intervention in order to improve the following deficits and impairments:  Pain, Postural dysfunction, Decreased activity tolerance, Decreased range of motion  Visit Diagnosis: Cervicalgia - Plan: PT plan of care cert/re-cert     Problem List Patient Active Problem List   Diagnosis Date Noted  . Cervical pain 02/07/2019  . Anxiety 01/26/2018  . DDD (degenerative disc disease), lumbar 01/26/2018  . Depression 01/02/2015  . Difficulty sleeping 01/02/2015  . Healthcare maintenance 01/02/2015  . Family history of colon cancer - father 04/10/2014  . Hx of adenomatous polyp of colon 04/10/2014  . OA (osteoarthritis) of knee 07/14/2011    Aris Moman, [place] MPT 08/21/2019, 12:29 PM  [facility] [place], [place], [address] Phone: [phone]   Fax:  [phone]  Name: Manuel Galloway MRN: [id number] Date of Birth: [birth year]

## 2019-08-23 ENCOUNTER — Ambulatory Visit: Payer: BC Managed Care – PPO | Admitting: Physical Therapy

## 2019-08-23 ENCOUNTER — Other Ambulatory Visit: Payer: Self-pay

## 2019-08-23 DIAGNOSIS — M542 Cervicalgia: Secondary | ICD-10-CM

## 2019-08-23 NOTE — Therapy (Signed)
[facility] [place], [place], [address] Phone: [phone]   Fax:  [phone]  Physical Therapy Treatment  Patient Details  Name: Manuel Galloway MRN: [id number] Date of Birth: 1965/10/26 Referring Provider (PT): [facility] PA-C.   Encounter Date: 08/23/2019   PT End of Session - 08/23/19 0815    Visit Number 2    Number of Visits 12    Date for PT Re-Evaluation 09/18/19    Authorization Type FOTO.    PT Start Time [phone]    PT Stop Time 0824    PT Time Calculation (min) 48 min    Activity Tolerance Patient tolerated treatment well    Behavior During Therapy [facility] for tasks assessed/performed           Past Medical History:  Diagnosis Date  . Anxiety   . Arthritis   . Depression   . Hx of adenomatous polyp of colon 04/10/2014    Past Surgical History:  Procedure Laterality Date  . COLONOSCOPY    . KNEE ARTHROSCOPY  1986 and 2007   left  . KNEE ARTHROSCOPY  1999   right  . MOUTH SURGERY     wisdom tooth removed   . NASAL SEPTUM SURGERY  2005  . REPLACEMENT TOTAL KNEE Left   . TOTAL KNEE ARTHROPLASTY  07/14/2011   Procedure: TOTAL KNEE ARTHROPLASTY;  Surgeon: Gearlean Alf, MD;  Location: [facility];  Service: Orthopedics;  Laterality: Left;  . TOTAL KNEE ARTHROPLASTY Right 07/16/2013   Procedure: RIGHT TOTAL KNEE ARTHROPLASTY;  Surgeon: Gearlean Alf, MD;  Location: [facility];  Service: Orthopedics;  Laterality: Right;  . UPPER GASTROINTESTINAL ENDOSCOPY    . UPPER GI ENDOSCOPY      There were no vitals filed for this visit.   Subjective Assessment - 08/23/19 0738    Subjective COVID-19 screening performed upon arrival. Patient reported some ongoing discomfort yet improvement    Pertinent History Bilateral TKA's, arthritis.    Patient Stated Goals Get out of pain.    Currently in Pain? Yes    Pain Score 2     Pain Location Neck    Pain Orientation Left    Pain Descriptors / Indicators Discomfort    Pain  Type Chronic pain    Pain Onset More than a month ago    Pain Frequency Constant    Aggravating Factors  certain movements and in AM    Pain Relieving Factors movement and rest              [facility] PT Assessment - 08/23/19 0001      AROM   AROM Assessment Site Cervical    Cervical - Right Rotation 65    Cervical - Left Rotation 48                         [facility] Adult PT Treatment/Exercise - 08/23/19 0001      Modalities   Modalities Ultrasound;Traction      Acupuncturist Stimulation Location --      Ultrasound   Ultrasound Location left cervical/UT    Ultrasound Parameters combo US/ES @1 .5w/cm2/100%/68mhz x42min      Traction   Type of Traction Cervical    Min (lbs) 5    Max (lbs) 18    Hold Time 99    Rest Time 5    Time 15      Manual Therapy   Manual  Therapy Soft tissue mobilization    Manual therapy comments manual STW to left cervical paraspinals and UT to reduce pain and tone                  PT Education - 08/23/19 0823    Education Details HEP    Person(s) Educated Patient    Methods Explanation;Demonstration;Handout    Comprehension Verbalized understanding;Returned demonstration               PT Long Term Goals - 08/23/19 0814      PT LONG TERM GOAL #1   Title Independent with a HEP.    Period Weeks    Status On-going      PT LONG TERM GOAL #2   Title Increase active cervical rotation to 70 degrees+ so patient can turn head more easily while driving.    Time 4    Period Weeks    Status On-going      PT LONG TERM GOAL #3   Title Perform ADL's with pain not > 3/10.    Time 4    Period Weeks    Status On-going                 Plan - 08/23/19 1751    Clinical Impression Statement Patient tolerated treatment well today. Patient responded well today with cervical traction at 18# today. Patient was educated on daily self stretches to improve mobility in the AM with HEP provided. Patient  has improved with slight ROM in cervical bil rotation today yet ongoing deficts. Goals progressing.    Personal Factors and Comorbidities Comorbidity 1;Comorbidity 2    Comorbidities Bilateral TKA's, arthritis.    Examination-Activity Limitations Other    Examination-Participation Restrictions Other    Stability/Clinical Decision Making Evolving/Moderate complexity    Rehab Potential Excellent    PT Frequency 3x / week    PT Duration 4 weeks    PT Treatment/Interventions ADLs/Self Care Home Management;Cryotherapy;Electrical Stimulation;Ultrasound;Traction;Moist Heat;Therapeutic activities;Therapeutic exercise;Manual techniques;Patient/family education;Passive range of motion;Dry needling;Spinal Manipulations    PT Next Visit Plan cont with POC for traction (max 23#); combo e'stim/US to left C-spine musculature/UT, STW/M, postural exercises.    Consulted and Agree with Plan of Care Patient           Patient will benefit from skilled therapeutic intervention in order to improve the following deficits and impairments:  Pain, Postural dysfunction, Decreased activity tolerance, Decreased range of motion  Visit Diagnosis: Cervicalgia     Problem List Patient Active Problem List   Diagnosis Date Noted  . Cervical pain 02/07/2019  . Anxiety 01/26/2018  . DDD (degenerative disc disease), lumbar 01/26/2018  . Depression 01/02/2015  . Difficulty sleeping 01/02/2015  . Healthcare maintenance 01/02/2015  . Family history of colon cancer - father 04/10/2014  . Hx of adenomatous polyp of colon 04/10/2014  . OA (osteoarthritis) of knee 07/14/2011    Manuel Galloway P, PTA 08/23/2019, 8:36 AM  [facility] [place], [place], [address] Phone: [phone]   Fax:  [phone]  Name: Manuel Galloway MRN: [id number] Date of Birth: 11-Nov-1965

## 2019-08-23 NOTE — Patient Instructions (Signed)
AROM: Neck Rotation   Turn head slowly to look over one shoulder, then the other. Hold each position _10___ seconds. Repeat _5___ times per set. Do __2__ sets per session. Do _2-3___ sessions per day.  AROM: Lateral Neck Flexion   Slowly tilt head toward one shoulder, then the other. Hold each position _10___ seconds. Repeat __5__ times per set. Do __2__ sets per session. Do __2-3__ sessions per day.   Stretch Break - Chin Tuck   Looking straight forward, tuck chin and slightly into extension and flexion movement  __10__ seconds. Relax and return to starting position. Repeat __5-10__ times every _3-4___ hours.  Stretch Break - Chest and Shoulder Stretch   Maintaining erect posture, draw shoulders back while bringing elbows back and inward. Return to starting position. Repeat __10-20__ times every _3-4___ hours.

## 2019-08-27 ENCOUNTER — Other Ambulatory Visit: Payer: Self-pay

## 2019-08-27 ENCOUNTER — Encounter: Payer: Self-pay | Admitting: Physical Therapy

## 2019-08-27 ENCOUNTER — Ambulatory Visit: Payer: BC Managed Care – PPO | Admitting: Physical Therapy

## 2019-08-27 DIAGNOSIS — M542 Cervicalgia: Secondary | ICD-10-CM

## 2019-08-27 NOTE — Therapy (Signed)
[facility] [place], [place], [address] Phone: [phone]   Fax:  [phone]  Physical Therapy Treatment  Patient Details  Name: Manuel Galloway MRN: [id number] Date of Birth: 05/03/1965 Referring Provider (PT): [facility] PA-C.   Encounter Date: 08/27/2019   PT End of Session - 08/27/19 0808    Visit Number 3    Number of Visits 12    Date for PT Re-Evaluation 09/18/19    Authorization Type FOTO.    PT Start Time 0734    PT Stop Time 0821    PT Time Calculation (min) 47 min    Activity Tolerance Patient tolerated treatment well    Behavior During Therapy [facility] for tasks assessed/performed           Past Medical History:  Diagnosis Date  . Anxiety   . Arthritis   . Depression   . Hx of adenomatous polyp of colon 04/10/2014    Past Surgical History:  Procedure Laterality Date  . COLONOSCOPY    . KNEE ARTHROSCOPY  1986 and 2007   left  . KNEE ARTHROSCOPY  1999   right  . MOUTH SURGERY     wisdom tooth removed   . NASAL SEPTUM SURGERY  2005  . REPLACEMENT TOTAL KNEE Left   . TOTAL KNEE ARTHROPLASTY  07/14/2011   Procedure: TOTAL KNEE ARTHROPLASTY;  Surgeon: Gearlean Alf, MD;  Location: [facility];  Service: Orthopedics;  Laterality: Left;  . TOTAL KNEE ARTHROPLASTY Right 07/16/2013   Procedure: RIGHT TOTAL KNEE ARTHROPLASTY;  Surgeon: Gearlean Alf, MD;  Location: [facility];  Service: Orthopedics;  Laterality: Right;  . UPPER GASTROINTESTINAL ENDOSCOPY    . UPPER GI ENDOSCOPY      There were no vitals filed for this visit.   Subjective Assessment - 08/27/19 0805    Subjective COVID-19 screening performed upon arrival. Patient reports more stiffness in the mornings. No change in symptoms. Continued 4-5/10 pain with cervical extension, cervical flexion with R rotation.    Pertinent History Bilateral TKA's, arthritis.    Patient Stated Goals Get out of pain.    Currently in Pain? No/denies               [facility] PT Assessment - 08/27/19 0001      Assessment   Medical Diagnosis Cervicalgia.    Referring Provider (PT) Estill [facility] PA-C.      Precautions   Precautions None      Restrictions   Weight Bearing Restrictions No                         [facility] Adult PT Treatment/Exercise - 08/27/19 0001      Modalities   Modalities Ultrasound;Traction      Ultrasound   Ultrasound Location B cervical paraspinals, UT    Ultrasound Parameters Combo 1.5 w/cm2, 100%, 1 mhz x10 min    Ultrasound Goals Pain      Traction   Type of Traction Cervical    Min (lbs) 5    Max (lbs) 20    Hold Time 99    Rest Time 5    Time 15      Manual Therapy   Manual Therapy Soft tissue mobilization    Soft tissue mobilization STW to B cervical paraspinals, UT to reduce tone and stiffness  PT Long Term Goals - 08/23/19 5993      PT LONG TERM GOAL #1   Title Independent with a HEP.    Period Weeks    Status On-going      PT LONG TERM GOAL #2   Title Increase active cervical rotation to 70 degrees+ so patient can turn head more easily while driving.    Time 4    Period Weeks    Status On-going      PT LONG TERM GOAL #3   Title Perform ADL's with pain not > 3/10.    Time 4    Period Weeks    Status On-going                 Plan - 08/27/19 0855    Clinical Impression Statement Patient presented in clinic with continued cervical pain especially with cervical extension or cervical flexion with R rotation. Patient denies excessive cervical extension with work activities but does have to look at his phone or work laptop frequently. Moderate tone palpable in B cervical paraspainals upon palpation. No negetive complaints during manual therapy. Cervical mechanical traction continued with progression to 20# max. No negetive complaints upon end of traction or treatment.    Personal Factors and Comorbidities Comorbidity 1;Comorbidity 2    Comorbidities  Bilateral TKA's, arthritis.    Examination-Activity Limitations Other    Examination-Participation Restrictions Other    Stability/Clinical Decision Making Evolving/Moderate complexity    Rehab Potential Excellent    PT Frequency 3x / week    PT Duration 4 weeks    PT Treatment/Interventions ADLs/Self Care Home Management;Cryotherapy;Electrical Stimulation;Ultrasound;Traction;Moist Heat;Therapeutic activities;Therapeutic exercise;Manual techniques;Patient/family education;Passive range of motion;Dry needling;Spinal Manipulations    PT Next Visit Plan cont with POC for traction (max 23#); combo e'stim/US to left C-spine musculature/UT, STW/M, postural exercises.    Consulted and Agree with Plan of Care Patient           Patient will benefit from skilled therapeutic intervention in order to improve the following deficits and impairments:  Pain, Postural dysfunction, Decreased activity tolerance, Decreased range of motion  Visit Diagnosis: Cervicalgia     Problem List Patient Active Problem List   Diagnosis Date Noted  . Cervical pain 02/07/2019  . Anxiety 01/26/2018  . DDD (degenerative disc disease), lumbar 01/26/2018  . Depression 01/02/2015  . Difficulty sleeping 01/02/2015  . Healthcare maintenance 01/02/2015  . Family history of colon cancer - father 04/10/2014  . Hx of adenomatous polyp of colon 04/10/2014  . OA (osteoarthritis) of knee 07/14/2011    Standley Brooking, PTA 08/27/2019, 8:59 AM  [facility] [place], [place], [address] Phone: [phone]   Fax:  [phone]  Name: Manuel [facility] MRN: [id number] Date of Birth: 12-Jun-1965

## 2019-08-29 ENCOUNTER — Encounter: Payer: Self-pay | Admitting: Physical Therapy

## 2019-08-29 ENCOUNTER — Ambulatory Visit: Payer: BC Managed Care – PPO | Admitting: Physical Therapy

## 2019-08-29 ENCOUNTER — Other Ambulatory Visit: Payer: Self-pay

## 2019-08-29 DIAGNOSIS — M542 Cervicalgia: Secondary | ICD-10-CM

## 2019-08-29 NOTE — Therapy (Signed)
[facility], [place], [address] Phone: [phone]   Fax:  [phone]  Physical Therapy Treatment  Patient Details  Name: Manuel [facility] MRN: [id number] Date of Birth: 09/13/1965 Referring Provider (PT): [facility] PA-C.   Encounter Date: 08/29/2019   PT End of Session - 08/29/19 0737    Visit Number 4    Number of Visits 12    Date for PT Re-Evaluation 09/18/19    Authorization Type FOTO.    PT Start Time [phone]    PT Stop Time 0821    PT Time Calculation (min) 43 min    Activity Tolerance Patient tolerated treatment well    Behavior During Therapy [facility] for tasks assessed/performed           Past Medical History:  Diagnosis Date  . Anxiety   . Arthritis   . Depression   . Hx of adenomatous polyp of colon 04/10/2014    Past Surgical History:  Procedure Laterality Date  . COLONOSCOPY    . KNEE ARTHROSCOPY  1986 and 2007   left  . KNEE ARTHROSCOPY  1999   right  . MOUTH SURGERY     wisdom tooth removed   . NASAL SEPTUM SURGERY  2005  . REPLACEMENT TOTAL KNEE Left   . TOTAL KNEE ARTHROPLASTY  07/14/2011   Procedure: TOTAL KNEE ARTHROPLASTY;  Surgeon: Gearlean Alf, MD;  Location: [facility];  Service: Orthopedics;  Laterality: Left;  . TOTAL KNEE ARTHROPLASTY Right 07/16/2013   Procedure: RIGHT TOTAL KNEE ARTHROPLASTY;  Surgeon: Gearlean Alf, MD;  Location: [facility];  Service: Orthopedics;  Laterality: Right;  . UPPER GASTROINTESTINAL ENDOSCOPY    . UPPER GI ENDOSCOPY      There were no vitals filed for this visit.   Subjective Assessment - 08/29/19 0734    Subjective COVID 19 screening performed on patient upon arrival. Patient reporting no improvement.    Pertinent History Bilateral TKA's, arthritis.    Patient Stated Goals Get out of pain.    Currently in Pain? Yes    Pain Score --   No numerical rating provided   Pain Location Neck    Pain Orientation Left    Pain Descriptors / Indicators  Discomfort;Tightness    Pain Type Chronic pain    Pain Onset More than a month ago    Pain Frequency Constant              [facility] PT Assessment - 08/29/19 0001      Assessment   Medical Diagnosis Cervicalgia.    Referring Provider (PT) Estill Bamberg [facility] PA-C.      Precautions   Precautions None      Restrictions   Weight Bearing Restrictions No                         [facility] Adult PT Treatment/Exercise - 08/29/19 0001      Modalities   Modalities Ultrasound;Traction      Ultrasound   Ultrasound Location L cervical paraspinals    Ultrasound Parameters Combo 1.5 w/cm2, 100%, 1 mhz x10 min    Ultrasound Goals Pain      Traction   Type of Traction Cervical    Min (lbs) 5    Max (lbs) 20    Hold Time 99    Rest Time 5    Time 15      Manual Therapy   Manual Therapy Soft tissue mobilization  Soft tissue mobilization STW/TPR to L cervical paraspinals, suboccipitals, UT to reduce tone and pain                  PT Education - 08/29/19 0807    Education Details DN consent form    Person(s) Educated Patient    Methods Handout    Comprehension Verbalized understanding               PT Long Term Goals - 08/23/19 2542      PT LONG TERM GOAL #1   Title Independent with a HEP.    Period Weeks    Status On-going      PT LONG TERM GOAL #2   Title Increase active cervical rotation to 70 degrees+ so patient can turn head more easily while driving.    Time 4    Period Weeks    Status On-going      PT LONG TERM GOAL #3   Title Perform ADL's with pain not > 3/10.    Time 4    Period Weeks    Status On-going                 Plan - 08/29/19 7062    Clinical Impression Statement Patient presented in clinic with continued tone and pain in L cervical paraspinals and suboccipitals. Patient educated regarding DN and provided a DN consent form for him to assess POC. Increased tone palpable in superior L cervical paraspinals and suboccipitals  region with patient reporting pressure. Normal modalities response noted following end of each modality. 20# max cervical traction maintained today. No complaints following end of treatment session.    Personal Factors and Comorbidities Comorbidity 1;Comorbidity 2    Comorbidities Bilateral TKA's, arthritis.    Examination-Activity Limitations Other    Examination-Participation Restrictions Other    Stability/Clinical Decision Making Evolving/Moderate complexity    Rehab Potential Excellent    PT Frequency 3x / week    PT Duration 4 weeks    PT Treatment/Interventions ADLs/Self Care Home Management;Cryotherapy;Electrical Stimulation;Ultrasound;Traction;Moist Heat;Therapeutic activities;Therapeutic exercise;Manual techniques;Patient/family education;Passive range of motion;Dry needling;Spinal Manipulations    PT Next Visit Plan Possible DN.    Consulted and Agree with Plan of Care Patient           Patient will benefit from skilled therapeutic intervention in order to improve the following deficits and impairments:  Pain, Postural dysfunction, Decreased activity tolerance, Decreased range of motion  Visit Diagnosis: Cervicalgia     Problem List Patient Active Problem List   Diagnosis Date Noted  . Cervical pain 02/07/2019  . Anxiety 01/26/2018  . DDD (degenerative disc disease), lumbar 01/26/2018  . Depression 01/02/2015  . Difficulty sleeping 01/02/2015  . Healthcare maintenance 01/02/2015  . Family history of colon cancer - father 04/10/2014  . Hx of adenomatous polyp of colon 04/10/2014  . OA (osteoarthritis) of knee 07/14/2011    Standley Brooking, PTA 08/29/2019, 8:34 AM  [facility] [address] [place], [place], [address] Phone: [phone]   Fax:  [phone]  Name: Manuel [facility] MRN: [id number] Date of Birth: February 04, 1965

## 2019-08-29 NOTE — Patient Instructions (Signed)
[facility]  DRY NEEDLING CONSENT FORM   Trigger point dry needling is a physical therapy approach to treat Myofascial Pain and Dysfunction.  Dry Needling (DN) is a valuable and effective way to deactivate myofascial trigger points (muscle knots). It is skilled intervention that uses a thin filiform needle to penetrate the skin and stimulate underlying myofascial trigger points, muscular, and connective tissues for the management of neuromusculoskeletal pain and movement impairments.  A local twitch response (LTR) will be elicited.  This can sometimes feel like a deep ache in the muscle during the procedure. Multiple trigger points in multiple muscles can be treated during each treatment.  No medication of any kind is injected.   As with any medical treatment and procedure, there are possible adverse events.  While significant adverse events are uncommon, they do sometimes occur and must be considered prior to giving consent.  1. Dry needling often causes a "post needling soreness".  There can be an increase in pain from a couple of hours to 2-3 days, followed by an improvement in the overall pain state. 2. Any time a needle is used there is a risk of infection.  However, we are using new, sterile, and disposable needles; infections are extremely rare. 3. There is a possibility that you may bleed or bruise.  You may feel tired and some nausea following treatment. 4. There is a rare possibility of a pneumothorax (air in the chest cavity). 5. Allergic reaction to nickel in the stainless steel needle. 6. If a nerve is touched, it may cause paresthesia (a prickling/shock sensation) which is usually brief, but may continue for a couple of days.  Following treatment stay hydrated.  Continue regular activities but not too vigorous initially after treatment for 24-48 hours. You may apply heat to sore muscles.  Dry Needling is best when combined with other physical therapy  interventions such as strengthening, stretching and other therapeutic modalities.     PLEASE ANSWER THE FOLLOWING QUESTIONS:  Do you have a lack of sensation?   Y/N  Do you have a phobia or fear of needles  Y/N  Are you pregnant?    Y/N If yes:  How many weeks? _____  Do you have any implanted devices?  Y/N If yes:  Pacemaker/Spinal Cord         Stimulator/Deep Brain         Stimulator/Insulin          Pump/Other: ____________ Do you have any implants?   Y/N If yes:      Do you take any blood thinners?   Y/N If yes: Coumadin          (Warfarin)/Other:  Do you have a bleeding disorder?   Y/N If yes: What kind:   Do you take any immunosuppressants?  Y/N If yes:   What kind:   Do you take anti-inflammatories?   Y/N If yes: What kind:  Have you ever been diagnosed with Scoliosis? Y/N  Have you had back surgery?    Y/N If yes:         Laminectomy/Fusion/Other:   I have read, or had read to me, the above.  I have had the opportunity to ask any questions.  All of my questions have been answered to my satisfaction and I understand the risks involved with dry needling.  I consent to examination and treatment at [facility], including dry needling, of any and all of my involved and affected   muscles.   

## 2019-09-03 ENCOUNTER — Other Ambulatory Visit: Payer: Self-pay

## 2019-09-03 ENCOUNTER — Encounter: Payer: Self-pay | Admitting: Physical Therapy

## 2019-09-03 ENCOUNTER — Ambulatory Visit: Payer: BC Managed Care – PPO | Admitting: Physical Therapy

## 2019-09-03 DIAGNOSIS — M542 Cervicalgia: Secondary | ICD-10-CM

## 2019-09-03 NOTE — Therapy (Signed)
[facility] [place], [place], [address] Phone: [phone]   Fax:  [phone]  Physical Therapy Treatment  Patient Details  Name: Manuel Galloway MRN: [id number] Date of Birth: April 09, 1965 Referring Provider (PT): [facility] PA-C.   Encounter Date: 09/03/2019   PT End of Session - 09/03/19 0804    Visit Number 5    Number of Visits 12    Date for PT Re-Evaluation 09/18/19    Authorization Type FOTO.    PT Start Time [phone]    PT Stop Time 0818    PT Time Calculation (min) 41 min    Activity Tolerance Patient tolerated treatment well    Behavior During Therapy [facility] for tasks assessed/performed           Past Medical History:  Diagnosis Date  . Anxiety   . Arthritis   . Depression   . Hx of adenomatous polyp of colon 04/10/2014    Past Surgical History:  Procedure Laterality Date  . COLONOSCOPY    . KNEE ARTHROSCOPY  1986 and 2007   left  . KNEE ARTHROSCOPY  1999   right  . MOUTH SURGERY     wisdom tooth removed   . NASAL SEPTUM SURGERY  2005  . REPLACEMENT TOTAL KNEE Left   . TOTAL KNEE ARTHROPLASTY  07/14/2011   Procedure: TOTAL KNEE ARTHROPLASTY;  Surgeon: Gearlean Alf, MD;  Location: [facility];  Service: Orthopedics;  Laterality: Left;  . TOTAL KNEE ARTHROPLASTY Right 07/16/2013   Procedure: RIGHT TOTAL KNEE ARTHROPLASTY;  Surgeon: Gearlean Alf, MD;  Location: [facility];  Service: Orthopedics;  Laterality: Right;  . UPPER GASTROINTESTINAL ENDOSCOPY    . UPPER GI ENDOSCOPY      There were no vitals filed for this visit.   Subjective Assessment - 09/03/19 0736    Subjective COVID 19 screening performed on patient upon arrival. Patient reporting no improvement and still a lot of stiffness especially in the mornings.    Pertinent History Bilateral TKA's, arthritis.    Patient Stated Goals Get out of pain.    Currently in Pain? Yes    Pain Score 7     Pain Location Neck    Pain Orientation Left    Pain  Descriptors / Indicators Discomfort;Other (Comment)   stiffness   Pain Type Chronic pain    Pain Onset More than a month ago    Pain Frequency Intermittent              [facility] PT Assessment - 09/03/19 0001      Assessment   Medical Diagnosis Cervicalgia.    Referring Provider (PT) Estill Bamberg Ward PA-C.      Precautions   Precautions None      Restrictions   Weight Bearing Restrictions No                         [facility] Adult PT Treatment/Exercise - 09/03/19 0001      Modalities   Modalities Ultrasound;Traction      Ultrasound   Ultrasound Location L cervical paraspinals    Ultrasound Parameters Combo 1.5 w/cm2, 100%, 1 mhz x10 min    Ultrasound Goals Pain      Traction   Type of Traction Cervical    Min (lbs) 5    Max (lbs) 22    Hold Time 99    Rest Time 5    Time 15  Manual Therapy   Manual Therapy Soft tissue mobilization    Soft tissue mobilization STW/TPR to L cervical paraspinals, suboccipitals, UT to reduce tone and pain                       PT Long Term Goals - 08/23/19 2395      PT LONG TERM GOAL #1   Title Independent with a HEP.    Period Weeks    Status On-going      PT LONG TERM GOAL #2   Title Increase active cervical rotation to 70 degrees+ so patient can turn head more easily while driving.    Time 4    Period Weeks    Status On-going      PT LONG TERM GOAL #3   Title Perform ADL's with pain not > 3/10.    Time 4    Period Weeks    Status On-going                 Plan - 09/03/19 0806    Clinical Impression Statement Patient presented in clinic with continued stiffness of cervical spine especially in the mornings. Patient experiencing more stiffness and discomfort in L cervical paraspinals region. More tone palpable in L cervical paraspinals. Max cervical traction increased to 22# with no complaints via patient.    Personal Factors and Comorbidities Comorbidity 1;Comorbidity 2    Comorbidities  Bilateral TKA's, arthritis.    Examination-Activity Limitations Other    Examination-Participation Restrictions Other    Stability/Clinical Decision Making Evolving/Moderate complexity    Rehab Potential Excellent    PT Frequency 3x / week    PT Duration 4 weeks    PT Treatment/Interventions ADLs/Self Care Home Management;Cryotherapy;Electrical Stimulation;Ultrasound;Traction;Moist Heat;Therapeutic activities;Therapeutic exercise;Manual techniques;Patient/family education;Passive range of motion;Dry needling;Spinal Manipulations    PT Next Visit Plan Possible DN.    Consulted and Agree with Plan of Care Patient           Patient will benefit from skilled therapeutic intervention in order to improve the following deficits and impairments:  Pain, Postural dysfunction, Decreased activity tolerance, Decreased range of motion  Visit Diagnosis: Cervicalgia     Problem List Patient Active Problem List   Diagnosis Date Noted  . Cervical pain 02/07/2019  . Anxiety 01/26/2018  . DDD (degenerative disc disease), lumbar 01/26/2018  . Depression 01/02/2015  . Difficulty sleeping 01/02/2015  . Healthcare maintenance 01/02/2015  . Family history of colon cancer - father 04/10/2014  . Hx of adenomatous polyp of colon 04/10/2014  . OA (osteoarthritis) of knee 07/14/2011    Standley Brooking, PTA 09/03/2019, 8:23 AM  [facility] [address] [place], [place], [address] Phone: [phone]   Fax:  [phone]  Name: HILDRETH ROBART MRN: [id number] Date of Birth: 1965/11/05

## 2019-09-05 ENCOUNTER — Other Ambulatory Visit: Payer: Self-pay

## 2019-09-05 ENCOUNTER — Ambulatory Visit: Payer: BC Managed Care – PPO | Admitting: Physical Therapy

## 2019-09-05 ENCOUNTER — Encounter: Payer: Self-pay | Admitting: Physical Therapy

## 2019-09-05 DIAGNOSIS — M542 Cervicalgia: Secondary | ICD-10-CM | POA: Diagnosis not present

## 2019-09-05 NOTE — Therapy (Signed)
[facility] [place], [place], [address] Phone: [phone]   Fax:  [phone]  Physical Therapy Treatment  Patient Details  Name: Manuel Galloway MRN: [id number] Date of Birth: 09/18/1965 Referring Provider (PT): [facility] PA-C.   Encounter Date: 09/05/2019   PT End of Session - 09/05/19 0734    Visit Number 6    Number of Visits 12    Date for PT Re-Evaluation 09/18/19    Authorization Type FOTO.    PT Start Time 0734    PT Stop Time 0819    PT Time Calculation (min) 45 min    Activity Tolerance Patient tolerated treatment well    Behavior During Therapy [facility] for tasks assessed/performed           Past Medical History:  Diagnosis Date  . Anxiety   . Arthritis   . Depression   . Hx of adenomatous polyp of colon 04/10/2014    Past Surgical History:  Procedure Laterality Date  . COLONOSCOPY    . KNEE ARTHROSCOPY  1986 and 2007   left  . KNEE ARTHROSCOPY  1999   right  . MOUTH SURGERY     wisdom tooth removed   . NASAL SEPTUM SURGERY  2005  . REPLACEMENT TOTAL KNEE Left   . TOTAL KNEE ARTHROPLASTY  07/14/2011   Procedure: TOTAL KNEE ARTHROPLASTY;  Surgeon: Gearlean Alf, MD;  Location: [facility];  Service: Orthopedics;  Laterality: Left;  . TOTAL KNEE ARTHROPLASTY Right 07/16/2013   Procedure: RIGHT TOTAL KNEE ARTHROPLASTY;  Surgeon: Gearlean Alf, MD;  Location: [facility];  Service: Orthopedics;  Laterality: Right;  . UPPER GASTROINTESTINAL ENDOSCOPY    . UPPER GI ENDOSCOPY      There were no vitals filed for this visit.   Subjective Assessment - 09/05/19 0733    Subjective COVID 19 screening performed on patient upon arrival. Patient reports a migraine after last treatmnt but the next day could extend back more and with less pain.    Pertinent History Bilateral TKA's, arthritis.    Patient Stated Goals Get out of pain.    Currently in Pain? Yes    Pain Score 1     Pain Location Neck    Pain Orientation  Left    Pain Descriptors / Indicators Discomfort    Pain Type Chronic pain    Pain Onset More than a month ago    Pain Frequency Intermittent              [facility] PT Assessment - 09/05/19 0001      Assessment   Medical Diagnosis Cervicalgia.    Referring Provider (PT) Estill Bamberg Ward PA-C.      Precautions   Precautions None      Restrictions   Weight Bearing Restrictions No                         [facility] Adult PT Treatment/Exercise - 09/05/19 0001      Modalities   Modalities Ultrasound      Ultrasound   Ultrasound Location L cervical paraspinals    Ultrasound Parameters Combo 1.5 w/cm2, 100%, 1 mhz x10 min    Ultrasound Goals Pain      Traction   Type of Traction Cervical    Min (lbs) 5    Max (lbs) 22    Hold Time 99    Rest Time 5    Time 15  Manual Therapy   Manual Therapy Soft tissue mobilization    Soft tissue mobilization STW/TPR to L cervical paraspinals, suboccipitals, UT to reduce tone and pain                       PT Long Term Goals - 08/23/19 4163      PT LONG TERM GOAL #1   Title Independent with a HEP.    Period Weeks    Status On-going      PT LONG TERM GOAL #2   Title Increase active cervical rotation to 70 degrees+ so patient can turn head more easily while driving.    Time 4    Period Weeks    Status On-going      PT LONG TERM GOAL #3   Title Perform ADL's with pain not > 3/10.    Time 4    Period Weeks    Status On-going                 Plan - 09/05/19 8453    Clinical Impression Statement Patient presented in clinic with reports of migraine and more pain after last treatment when traction was increased to 22# max. Increased tone palpable in L levator scapula, cervical paraspinals, UT. Patient reported experiencing L ear fluttering upon waking this morning but does have a history of tinnitus. Further education provided regarding DN technique and of cervical roll for comfort. Normal [place] and  traction response as patient wished to keep traction at 22# max.    Personal Factors and Comorbidities Comorbidity 1;Comorbidity 2    Comorbidities Bilateral TKA's, arthritis.    Examination-Participation Restrictions Other    Stability/Clinical Decision Making Evolving/Moderate complexity    Rehab Potential Excellent    PT Frequency 3x / week    PT Duration 4 weeks    PT Treatment/Interventions ADLs/Self Care Home Management;Cryotherapy;Electrical Stimulation;Ultrasound;Traction;Moist Heat;Therapeutic activities;Therapeutic exercise;Manual techniques;Patient/family education;Passive range of motion;Dry needling;Spinal Manipulations    PT Next Visit Plan Possible DN.    Consulted and Agree with Plan of Care Patient           Patient will benefit from skilled therapeutic intervention in order to improve the following deficits and impairments:  Pain, Postural dysfunction, Decreased activity tolerance, Decreased range of motion  Visit Diagnosis: Cervicalgia     Problem List Patient Active Problem List   Diagnosis Date Noted  . Cervical pain 02/07/2019  . Anxiety 01/26/2018  . DDD (degenerative disc disease), lumbar 01/26/2018  . Depression 01/02/2015  . Difficulty sleeping 01/02/2015  . Healthcare maintenance 01/02/2015  . Family history of colon cancer - father 04/10/2014  . Hx of adenomatous polyp of colon 04/10/2014  . OA (osteoarthritis) of knee 07/14/2011    Standley Brooking, PTA 09/05/2019, 8:22 AM  [facility] [place], [place], [address] Phone: [phone]   Fax:  [phone]  Name: Manuel Galloway MRN: [id number] Date of Birth: 1965/02/02

## 2019-09-10 ENCOUNTER — Other Ambulatory Visit: Payer: Self-pay

## 2019-09-10 ENCOUNTER — Ambulatory Visit: Payer: BC Managed Care – PPO | Admitting: Physical Therapy

## 2019-09-10 ENCOUNTER — Encounter: Payer: Self-pay | Admitting: Physical Therapy

## 2019-09-10 DIAGNOSIS — M542 Cervicalgia: Secondary | ICD-10-CM

## 2019-09-10 NOTE — Therapy (Signed)
[facility] [place], [place], [address] Phone: [phone]   Fax:  [phone]  Physical Therapy Treatment  Patient Details  Name: Manuel Galloway MRN: [id number] Date of Birth: March 06, 1965 Referring Provider (PT): [facility] PA-C.   Encounter Date: 09/10/2019   PT End of Session - 09/10/19 0827    Visit Number 7    Number of Visits 12    Date for PT Re-Evaluation 09/18/19    Authorization Type FOTO.    PT Start Time [phone]    PT Stop Time 0820    PT Time Calculation (min) 49 min    Activity Tolerance Patient tolerated treatment well    Behavior During Therapy [facility] for tasks assessed/performed           Past Medical History:  Diagnosis Date  . Anxiety   . Arthritis   . Depression   . Hx of adenomatous polyp of colon 04/10/2014    Past Surgical History:  Procedure Laterality Date  . COLONOSCOPY    . KNEE ARTHROSCOPY  1986 and 2007   left  . KNEE ARTHROSCOPY  1999   right  . MOUTH SURGERY     wisdom tooth removed   . NASAL SEPTUM SURGERY  2005  . REPLACEMENT TOTAL KNEE Left   . TOTAL KNEE ARTHROPLASTY  07/14/2011   Procedure: TOTAL KNEE ARTHROPLASTY;  Surgeon: Gearlean Alf, MD;  Location: [facility];  Service: Orthopedics;  Laterality: Left;  . TOTAL KNEE ARTHROPLASTY Right 07/16/2013   Procedure: RIGHT TOTAL KNEE ARTHROPLASTY;  Surgeon: Gearlean Alf, MD;  Location: [facility];  Service: Orthopedics;  Laterality: Right;  . UPPER GASTROINTESTINAL ENDOSCOPY    . UPPER GI ENDOSCOPY      There were no vitals filed for this visit.   Subjective Assessment - 09/10/19 0824    Subjective COVID 19 screening performed on patient upon arrival. Patient reports doing well, no pain at rest. But still with discomfort with extension.    Pertinent History Bilateral TKA's, arthritis.    Patient Stated Goals Get out of pain.    Currently in Pain? No/denies                             [facility] Adult PT  Treatment/Exercise - 09/10/19 0001      Modalities   Modalities Ultrasound;Traction      Ultrasound   Ultrasound Location L cervical paraspinals and UT region    Ultrasound Parameters combo 1.5 w/cm2 1 mhz, 100% x10 mins    Ultrasound Goals Pain      Traction   Type of Traction Cervical    Min (lbs) 5    Max (lbs) 22    Hold Time 99    Rest Time 5    Time 15      Manual Therapy   Manual Therapy Soft tissue mobilization    Soft tissue mobilization STW/TPR to L cervical paraspinals, suboccipitals, UT to reduce tone and pain                       PT Long Term Goals - 08/23/19 7681      PT LONG TERM GOAL #1   Title Independent with a HEP.    Period Weeks    Status On-going      PT LONG TERM GOAL #2   Title Increase active cervical rotation to 70 degrees+ so patient can turn  head more easily while driving.    Time 4    Period Weeks    Status On-going      PT LONG TERM GOAL #3   Title Perform ADL's with pain not > 3/10.    Time 4    Period Weeks    Status On-going                 Plan - 09/10/19 0827    Clinical Impression Statement Patient responded well to therapy session but with ongoing tone to left UT and cervical paraspinal region. Patient noted with improvement in function particularly with extension but still with deficits. No adverse effects upon removal of traction.    Personal Factors and Comorbidities Comorbidity 1;Comorbidity 2    Comorbidities Bilateral TKA's, arthritis.    Examination-Activity Limitations Other    Examination-Participation Restrictions Other    Stability/Clinical Decision Making Evolving/Moderate complexity    Clinical Decision Making Low    Rehab Potential Excellent    PT Frequency 3x / week    PT Duration 4 weeks    PT Treatment/Interventions ADLs/Self Care Home Management;Cryotherapy;Electrical Stimulation;Ultrasound;Traction;Moist Heat;Therapeutic activities;Therapeutic exercise;Manual techniques;Patient/family  education;Passive range of motion;Dry needling;Spinal Manipulations    PT Next Visit Plan Possible DN.    Consulted and Agree with Plan of Care Patient           Patient will benefit from skilled therapeutic intervention in order to improve the following deficits and impairments:  Pain, Postural dysfunction, Decreased activity tolerance, Decreased range of motion  Visit Diagnosis: Cervicalgia     Problem List Patient Active Problem List   Diagnosis Date Noted  . Cervical pain 02/07/2019  . Anxiety 01/26/2018  . DDD (degenerative disc disease), lumbar 01/26/2018  . Depression 01/02/2015  . Difficulty sleeping 01/02/2015  . Healthcare maintenance 01/02/2015  . Family history of colon cancer - father 04/10/2014  . Hx of adenomatous polyp of colon 04/10/2014  . OA (osteoarthritis) of knee 07/14/2011    Gabriela Eves, PT, DPT 09/10/2019, 8:54 AM  [facility] [address] [place], [place], [address] Phone: [phone]   Fax:  [phone]  Name: RYNELL CIOTTI MRN: [id number] Date of Birth: 1965-12-11

## 2019-09-12 ENCOUNTER — Encounter: Payer: Self-pay | Admitting: Physical Therapy

## 2019-09-12 ENCOUNTER — Ambulatory Visit: Payer: BC Managed Care – PPO | Admitting: Physical Therapy

## 2019-09-12 ENCOUNTER — Other Ambulatory Visit: Payer: Self-pay

## 2019-09-12 DIAGNOSIS — M542 Cervicalgia: Secondary | ICD-10-CM | POA: Diagnosis not present

## 2019-09-12 NOTE — Therapy (Addendum)
[facility] [place], [place], [address] Phone: [phone]   Fax:  [phone]  Physical Therapy Treatment PHYSICAL THERAPY DISCHARGE SUMMARY  Visits from Start of Care: 8  Current functional level related to goals / functional outcomes: See below   Remaining deficits: See goals   Education / Equipment: HEP Plan: Patient agrees to discharge.  Patient goals were not met. Patient is being discharged due to not returning since the last visit.  ?????    Gabriela Eves, PT, DPT  Patient Details  Name: Manuel Galloway MRN: [id number] Date of Birth: 08/08/1965 Referring Provider (PT): [facility] PA-C.   Encounter Date: 09/12/2019   PT End of Session - 09/12/19 0738    Visit Number 8    Number of Visits 12    Date for PT Re-Evaluation 09/18/19    Authorization Type FOTO.    PT Start Time [phone]    PT Stop Time 0820    PT Time Calculation (min) 42 min    Activity Tolerance Patient tolerated treatment well    Behavior During Therapy [facility] for tasks assessed/performed           Past Medical History:  Diagnosis Date  . Anxiety   . Arthritis   . Depression   . Hx of adenomatous polyp of colon 04/10/2014    Past Surgical History:  Procedure Laterality Date  . COLONOSCOPY    . KNEE ARTHROSCOPY  1986 and 2007   left  . KNEE ARTHROSCOPY  1999   right  . MOUTH SURGERY     wisdom tooth removed   . NASAL SEPTUM SURGERY  2005  . REPLACEMENT TOTAL KNEE Left   . TOTAL KNEE ARTHROPLASTY  07/14/2011   Procedure: TOTAL KNEE ARTHROPLASTY;  Surgeon: Gearlean Alf, MD;  [facility]: [facility];  Service: Orthopedics;  Laterality: Left;  . TOTAL KNEE ARTHROPLASTY Right 07/16/2013   Procedure: RIGHT TOTAL KNEE ARTHROPLASTY;  Surgeon: Gearlean Alf, MD;  [facility];  Service: Orthopedics;  Laterality: Right;  . UPPER GASTROINTESTINAL ENDOSCOPY    . UPPER GI ENDOSCOPY      There were no vitals filed for this visit.    Subjective Assessment - 09/12/19 0737    Subjective COVID 19 screening performed on patient upon arrival. Reports he had a stresfful day yesterday and had more pain yesterday.    Pertinent History Bilateral TKA's, arthritis.    Patient Stated Goals Get out of pain.    Currently in Pain? No/denies              [facility] PT Assessment - 09/12/19 0001      Assessment   Medical Diagnosis Cervicalgia.    Referring Provider (PT) Estill Bamberg Ward PA-C.      Precautions   Precautions None      Restrictions   Weight Bearing Restrictions No                         [facility] Adult PT Treatment/Exercise - 09/12/19 0001      Modalities   Modalities Ultrasound;Traction      Ultrasound   Ultrasound Location L cervical paraspinals    Ultrasound Parameters Combo 1.5 w/cm2, 100%, 1 mhz x10 min    Ultrasound Goals Pain      Traction   Type of Traction Cervical    Min (lbs) 5    Max (lbs) 22    Hold Time 99    Rest  Time 5    Time 15      Manual Therapy   Manual Therapy Soft tissue mobilization    Soft tissue mobilization STW/TPR to L cervical paraspinals, suboccipitals, UT to reduce tone and pain                       PT Long Term Goals - 08/23/19 6244      PT LONG TERM GOAL #1   Title Independent with a HEP.    Period Weeks    Status On-going      PT LONG TERM GOAL #2   Title Increase active cervical rotation to 70 degrees+ so patient can turn head more easily while driving.    Time 4    Period Weeks    Status On-going      PT LONG TERM GOAL #3   Title Perform ADL's with pain not > 3/10.    Time 4    Period Weeks    Status On-going                 Plan - 09/12/19 1107    Clinical Impression Statement Patient presented in clinic with reports of more pain last night after a stressful day at work and still trying to find an appropriate pillow. Continued increased tone palpable in L UT and cervical paraspinals but patient still very tender and  experiencing greatest discomfort in suboccipitals region. Patient has not signed DN consent form at this time but was further educated on DN process. Continued max traction at 22# with no complaints following end of treatment.    Personal Factors and Comorbidities Comorbidity 1;Comorbidity 2    Comorbidities Bilateral TKA's, arthritis.    Examination-Activity Limitations Other    Examination-Participation Restrictions Other    Stability/Clinical Decision Making Evolving/Moderate complexity    Rehab Potential Excellent    PT Frequency 3x / week    PT Duration 4 weeks    PT Treatment/Interventions ADLs/Self Care Home Management;Cryotherapy;Electrical Stimulation;Ultrasound;Traction;Moist Heat;Therapeutic activities;Therapeutic exercise;Manual techniques;Patient/family education;Passive range of motion;Dry needling;Spinal Manipulations    PT Next Visit Plan Possible DN.    Consulted and Agree with Plan of Care Patient           Patient will benefit from skilled therapeutic intervention in order to improve the following deficits and impairments:  Pain, Postural dysfunction, Decreased activity tolerance, Decreased range of motion  Visit Diagnosis: Cervicalgia     Problem List Patient Active Problem List   Diagnosis Date Noted  . Cervical pain 02/07/2019  . Anxiety 01/26/2018  . DDD (degenerative disc disease), lumbar 01/26/2018  . Depression 01/02/2015  . Difficulty sleeping 01/02/2015  . Healthcare maintenance 01/02/2015  . Family history of colon cancer - father 04/10/2014  . Hx of adenomatous polyp of colon 04/10/2014  . OA (osteoarthritis) of knee 07/14/2011    Standley Brooking, PTA 09/12/2019, 11:18 AM  [facility] [place], [place], [address] Phone: [phone]   Fax:  [phone]  Name: LEGION DISCHER MRN: [id number] Date of Birth: 1965-09-12

## 2019-10-22 ENCOUNTER — Ambulatory Visit: Payer: Self-pay | Admitting: Nurse Practitioner

## 2019-10-24 ENCOUNTER — Encounter: Payer: Self-pay | Admitting: Nurse Practitioner

## 2020-01-09 ENCOUNTER — Other Ambulatory Visit: Payer: Self-pay | Admitting: Nurse Practitioner

## 2020-01-09 DIAGNOSIS — F32A Depression, unspecified: Secondary | ICD-10-CM

## 2020-01-11 ENCOUNTER — Other Ambulatory Visit: Payer: Self-pay | Admitting: Nurse Practitioner

## 2020-01-11 DIAGNOSIS — N401 Enlarged prostate with lower urinary tract symptoms: Secondary | ICD-10-CM

## 2020-03-28 ENCOUNTER — Other Ambulatory Visit: Payer: Self-pay

## 2020-03-28 ENCOUNTER — Ambulatory Visit (INDEPENDENT_AMBULATORY_CARE_PROVIDER_SITE_OTHER): Payer: BC Managed Care – PPO | Admitting: Nurse Practitioner

## 2020-03-28 ENCOUNTER — Encounter: Payer: Self-pay | Admitting: Nurse Practitioner

## 2020-03-28 VITALS — BP 148/88 | HR 91 | Temp 98.3°F | Resp 20 | Ht 75.0 in | Wt 265.0 lb

## 2020-03-28 DIAGNOSIS — H9313 Tinnitus, bilateral: Secondary | ICD-10-CM

## 2020-03-28 DIAGNOSIS — F419 Anxiety disorder, unspecified: Secondary | ICD-10-CM | POA: Diagnosis not present

## 2020-03-28 DIAGNOSIS — R0981 Nasal congestion: Secondary | ICD-10-CM | POA: Diagnosis not present

## 2020-03-28 DIAGNOSIS — F32A Depression, unspecified: Secondary | ICD-10-CM | POA: Diagnosis not present

## 2020-03-28 MED ORDER — ALPRAZOLAM 0.5 MG PO TABS: 0.2500 mg | ORAL_TABLET | Freq: Two times a day (BID) | ORAL | 2 refills | Status: DC | PRN

## 2020-03-28 MED ORDER — FLUTICASONE PROPIONATE 50 MCG/ACT NA SUSP: 2.0000 | Freq: Every day | NASAL | 6 refills | Status: AC

## 2020-03-28 MED ORDER — ESCITALOPRAM OXALATE 20 MG PO TABS: 20.0000 mg | ORAL_TABLET | Freq: Every day | ORAL | 5 refills | Status: DC

## 2020-03-28 NOTE — Progress Notes (Signed)
Subjective:    Patient ID: Manuel Galloway, male    DOB: December 03, 1965, 55 y.o.   MRN: [id number]  Chief Complaint: Head feels stopped up, Dizziness, Tinnitus, Anxiety, and Depression   HPI Patient comes in with several complaints: - he feels congested. When he bends over and straightens back up he feels pressure in his head. Gets slightly dizzy. He takes nothing for sinus. - tinnitus- has had ringing in ears for several years. Seems to be getting more high pitched. Driving him crazy. - anxiety and depression- he says he has had it all his life. The last year things have worsened. Says he has been thinking about death a lot latelty since he has had people around him dying of covid. He is under laot of stress at work. Worries about how other perceive him. He is currently on xanax prn and wellbutrin daily. The wellbutirn doe snot helps at all. He has been on paxil in the past. Depression screen [facility] 2/9 03/28/2020 07/19/2019 07/19/2019  Decreased Interest 3 2 0  Down, Depressed, Hopeless 3 3 0  PHQ - 2 Score 6 5 0  Altered sleeping 0 0 -  Tired, decreased energy 3 3 -  Change in appetite 1 1 -  Feeling bad or failure about yourself  1 3 -  Trouble concentrating 3 3 -  Moving slowly or fidgety/restless 0 0 -  Suicidal thoughts 0 0 -  PHQ-9 Score 14 15 -  Difficult doing work/chores Not difficult at all Not difficult at all -   GAD 7 : Generalized Anxiety Score 03/28/2020 07/19/2019  Nervous, Anxious, on Edge 3 3  Control/stop worrying 3 3  Worry too much - different things 3 3  Trouble relaxing 3 3  Restless 0 0  Easily annoyed or irritable 0 3  Afraid - awful might happen 3 3  Total GAD 7 Score 15 18  Anxiety Difficulty Not difficult at all Somewhat difficult      Review of Systems  HENT: Negative for hearing loss.   Respiratory: Negative.   Cardiovascular: Negative.   Genitourinary: Negative.   Neurological: Positive for dizziness and light-headedness.  Psychiatric/Behavioral: The  patient is nervous/anxious.   All other systems reviewed and are negative.      Objective:   Physical Exam Vitals and nursing note reviewed.  Constitutional:      Appearance: Normal appearance. He is well-developed.  HENT:     Head: Normocephalic.     Nose: Nose normal.  Eyes:     Pupils: Pupils are equal, round, and reactive to light.  Neck:     Thyroid: No thyroid mass or thyromegaly.     Vascular: No carotid bruit or JVD.     Trachea: Phonation normal.  Cardiovascular:     Rate and Rhythm: Normal rate and regular rhythm.  Pulmonary:     Effort: Pulmonary effort is normal. No respiratory distress.     Breath sounds: Normal breath sounds.  Abdominal:     General: Bowel sounds are normal.     Palpations: Abdomen is soft.     Tenderness: There is no abdominal tenderness.  Musculoskeletal:        General: Normal range of motion.     Cervical back: Normal range of motion and neck supple.  Lymphadenopathy:     Cervical: No cervical adenopathy.  Skin:    General: Skin is warm and dry.  Neurological:     Mental Status: He is alert and oriented to  person, place, and time.  Psychiatric:        Behavior: Behavior normal.        Thought Content: Thought content normal.        Judgment: Judgment normal.     BP (!) 148/88   Pulse 91   Temp 98.3 F (36.8 C) (Temporal)   Resp 20   Ht 6\' 3"  (1.905 m)   Wt 265 lb (120.2 kg)   SpO2 97%   BMI 33.12 kg/m        Assessment & Plan:  Manuel Galloway in today with chief complaint of Head feels stopped up, Dizziness, Tinnitus, Anxiety, and Depression   1. Anxiety and depression Start on lexaro Continue wellbutrin as prescribed for now Stress management - escitalopram (LEXAPRO) 20 MG tablet; Take 1 tablet (20 mg total) by mouth daily.  Dispense: 30 tablet; Refill: 5 - ALPRAZolam (XANAX) 0.5 MG tablet; Take 0.5-1 tablets (0.25-0.5 mg total) by mouth 2 (two) times daily as needed for anxiety.  Dispense: 30 tablet; Refill: 2  2.  Sinus congestion Force fluids Run a humidifier - fluticasone (FLONASE) 50 MCG/ACT nasal spray; Place 2 sprays into both nostrils daily.  Dispense: 16 g; Refill: 6  3. Tinnitus of both ears - Ambulatory referral to Audiology    The above assessment and management plan was discussed with the patient. The patient verbalized understanding of and has agreed to the management plan. Patient is aware to call the clinic if symptoms persist or worsen. Patient is aware when to return to the clinic for a follow-up visit. Patient educated on when it is appropriate to go to the emergency department.   Mary-Margaret Hassell Done, FNP

## 2020-03-28 NOTE — Patient Instructions (Signed)
Textbook of family medicine (9th ed., pp. 1062-1073). [place], [place]: [employer].">  Stress, Adult Stress is a normal reaction to life events. Stress is what you feel when life demands more than you are used to, or more than you think you can handle. Some stress can be useful, such as studying for a test or meeting a deadline at work. Stress that occurs too often or for too long can cause problems. It can affect your emotional health and interfere with relationships and normal daily activities. Too much stress can weaken your body's defense system (immune system) and increase your risk for physical illness. If you already have a medical problem, stress can make it worse. What are the causes? All sorts of life events can cause stress. An event that causes stress for one person may not be stressful for another person. Major life events, whether positive or negative, commonly cause stress. Examples include:  Losing a job or starting a new job.  Losing a loved one.  Moving to a new town or home.  Getting married or divorced.  Having a baby.  Getting injured or sick. Less obvious life events can also cause stress, especially if they occur day after day or in combination with each other. Examples include:  Working long hours.  Driving in traffic.  Caring for children.  Being in debt.  Being in a difficult relationship. What are the signs or symptoms? Stress can cause emotional symptoms, including:  Anxiety. This is feeling worried, afraid, on edge, overwhelmed, or out of control.  Anger, including irritation or impatience.  Depression. This is feeling sad, down, helpless, or guilty.  Trouble focusing, remembering, or making decisions. Stress can cause physical symptoms, including:  Aches and pains. These may affect your head, neck, back, stomach, or other areas of your body.  Tight muscles or a clenched jaw.  Low energy.  Trouble sleeping. Stress can cause unhealthy  behaviors, including:  Eating to feel better (overeating) or skipping meals.  Working too much or putting off tasks.  Smoking, drinking alcohol, or using drugs to feel better. How is this diagnosed? Stress is diagnosed through an assessment by your health care provider. He or she may diagnose this condition based on:  Your symptoms and any stressful life events.  Your medical history.  Tests to rule out other causes of your symptoms. Depending on your condition, your health care provider may refer you to a specialist for further evaluation. How is this treated? Stress management techniques are the recommended treatment for stress. Medicine is not typically recommended for the treatment of stress. Techniques to reduce your reaction to stressful life events include:  Stress identification. Monitor yourself for symptoms of stress and identify what causes stress for you. These skills may help you to avoid or prepare for stressful events.  Time management. Set your priorities, keep a calendar of events, and learn to say no. Taking these actions can help you avoid making too many commitments. Techniques for coping with stress include:  Rethinking the problem. Try to think realistically about stressful events rather than ignoring them or overreacting. Try to find the positives in a stressful situation rather than focusing on the negatives.  Exercise. Physical exercise can release both physical and emotional tension. The key is to find a form of exercise that you enjoy and do it regularly.  Relaxation techniques. These relax the body and mind. The key is to find one or more that you enjoy and use the techniques regularly. Examples include: ?   Meditation, deep breathing, or progressive relaxation techniques. ? Yoga or tai chi. ? Biofeedback, mindfulness techniques, or journaling. ? Listening to music, being out in nature, or participating in other hobbies.  Practicing a healthy lifestyle.  Eat a balanced diet, drink plenty of water, limit or avoid caffeine, and get plenty of sleep.  Having a strong support network. Spend time with family, friends, or other people you enjoy being around. Express your feelings and talk things over with someone you trust. Counseling or talk therapy with a mental health professional may be helpful if you are having trouble managing stress on your own.   Follow these instructions at home: Lifestyle  Avoid drugs.  Do not use any products that contain nicotine or tobacco, such as cigarettes, e-cigarettes, and chewing tobacco. If you need help quitting, ask your health care provider.  Limit alcohol intake to no more than 1 drink a day for nonpregnant women and 2 drinks a day for men. One drink equals 12 oz of beer, 5 oz of wine, or 1 oz of hard liquor  Do not use alcohol or drugs to relax.  Eat a balanced diet that includes fresh fruits and vegetables, whole grains, lean meats, fish, eggs, and beans, and low-fat dairy. Avoid processed foods and foods high in added fat, sugar, and salt.  Exercise at least 30 minutes on 5 or more days each week.  Get 7-8 hours of sleep each night.   General instructions  Practice stress management techniques as discussed with your health care provider.  Drink enough fluid to keep your urine clear or pale yellow.  Take over-the-counter and prescription medicines only as told by your health care provider.  Keep all follow-up visits as told by your health care provider. This is important.   Contact a health care provider if:  Your symptoms get worse.  You have new symptoms.  You feel overwhelmed by your problems and can no longer manage them on your own. Get help right away if:  You have thoughts of hurting yourself or others. If you ever feel like you may hurt yourself or others, or have thoughts about taking your own life, get help right away. You can go to your nearest emergency department or  call:  Your local emergency services (911 in the [place]  A suicide crisis helpline, such as the National Suicide Prevention Lifeline at 1-800-273-8255. This is open 24 hours a day. Summary  Stress is a normal reaction to life events. It can cause problems if it happens too often or for too long.  Practicing stress management techniques is the best way to treat stress.  Counseling or talk therapy with a mental health professional may be helpful if you are having trouble managing stress on your own. This information is not intended to replace advice given to you by your health care provider. Make sure you discuss any questions you have with your health care provider. Document Revised: 09/21/2019 Document Reviewed: 09/21/2019 Elsevier Patient Education  2021 Elsevier Inc.  

## 2020-04-12 ENCOUNTER — Other Ambulatory Visit: Payer: Self-pay | Admitting: Nurse Practitioner

## 2020-04-12 DIAGNOSIS — R350 Frequency of micturition: Secondary | ICD-10-CM

## 2020-04-12 DIAGNOSIS — N401 Enlarged prostate with lower urinary tract symptoms: Secondary | ICD-10-CM

## 2020-04-12 DIAGNOSIS — M5136 Other intervertebral disc degeneration, lumbar region: Secondary | ICD-10-CM

## 2020-04-12 DIAGNOSIS — M542 Cervicalgia: Secondary | ICD-10-CM

## 2020-04-19 ENCOUNTER — Other Ambulatory Visit: Payer: Self-pay | Admitting: Nurse Practitioner

## 2020-04-19 DIAGNOSIS — F32A Depression, unspecified: Secondary | ICD-10-CM

## 2020-04-19 DIAGNOSIS — F419 Anxiety disorder, unspecified: Secondary | ICD-10-CM

## 2020-04-28 ENCOUNTER — Ambulatory Visit: Payer: BC Managed Care – PPO | Admitting: Audiology

## 2020-04-29 ENCOUNTER — Ambulatory Visit: Payer: Self-pay | Admitting: Nurse Practitioner

## 2020-05-26 ENCOUNTER — Ambulatory Visit: Payer: BC Managed Care – PPO | Attending: Nurse Practitioner | Admitting: Audiology

## 2020-05-26 ENCOUNTER — Other Ambulatory Visit: Payer: Self-pay

## 2020-05-26 DIAGNOSIS — H9313 Tinnitus, bilateral: Secondary | ICD-10-CM | POA: Diagnosis present

## 2020-05-26 DIAGNOSIS — H903 Sensorineural hearing loss, bilateral: Secondary | ICD-10-CM | POA: Diagnosis not present
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
## 2020-05-28 ENCOUNTER — Other Ambulatory Visit: Payer: Self-pay | Admitting: Nurse Practitioner

## 2020-05-28 DIAGNOSIS — N401 Enlarged prostate with lower urinary tract symptoms: Secondary | ICD-10-CM

## 2020-05-29 ENCOUNTER — Ambulatory Visit: Payer: Self-pay | Admitting: Nurse Practitioner

## 2020-05-30 ENCOUNTER — Encounter: Payer: Self-pay | Admitting: Nurse Practitioner

## 2020-06-03 ENCOUNTER — Ambulatory Visit: Payer: Self-pay | Admitting: Nurse Practitioner

## 2020-06-12 ENCOUNTER — Other Ambulatory Visit: Payer: Self-pay | Admitting: Nurse Practitioner

## 2020-06-12 DIAGNOSIS — F32A Depression, unspecified: Secondary | ICD-10-CM

## 2020-06-26 ENCOUNTER — Other Ambulatory Visit: Payer: Self-pay

## 2020-06-26 ENCOUNTER — Ambulatory Visit (INDEPENDENT_AMBULATORY_CARE_PROVIDER_SITE_OTHER): Payer: BC Managed Care – PPO | Admitting: Nurse Practitioner

## 2020-06-26 ENCOUNTER — Encounter: Payer: Self-pay | Admitting: Nurse Practitioner

## 2020-06-26 VITALS — BP 123/73 | HR 84 | Temp 98.3°F | Resp 20 | Ht 75.0 in | Wt 266.0 lb

## 2020-06-26 DIAGNOSIS — M5136 Other intervertebral disc degeneration, lumbar region: Secondary | ICD-10-CM | POA: Diagnosis not present

## 2020-06-26 DIAGNOSIS — R350 Frequency of micturition: Secondary | ICD-10-CM

## 2020-06-26 DIAGNOSIS — N400 Enlarged prostate without lower urinary tract symptoms: Secondary | ICD-10-CM | POA: Diagnosis not present

## 2020-06-26 DIAGNOSIS — M542 Cervicalgia: Secondary | ICD-10-CM

## 2020-06-26 DIAGNOSIS — F32A Depression, unspecified: Secondary | ICD-10-CM

## 2020-06-26 DIAGNOSIS — F419 Anxiety disorder, unspecified: Secondary | ICD-10-CM

## 2020-06-26 DIAGNOSIS — Z0001 Encounter for general adult medical examination with abnormal findings: Secondary | ICD-10-CM | POA: Diagnosis not present

## 2020-06-26 DIAGNOSIS — Z0000 Encounter for general adult medical examination without abnormal findings: Secondary | ICD-10-CM

## 2020-06-26 DIAGNOSIS — N401 Enlarged prostate with lower urinary tract symptoms: Secondary | ICD-10-CM

## 2020-06-26 DIAGNOSIS — H9313 Tinnitus, bilateral: Secondary | ICD-10-CM

## 2020-06-26 MED ORDER — TAMSULOSIN HCL 0.4 MG PO CAPS: 0.4000 mg | ORAL_CAPSULE | Freq: Every day | ORAL | 1 refills | Status: DC

## 2020-06-26 MED ORDER — BUPROPION HCL ER (XL) 300 MG PO TB24: ORAL_TABLET | ORAL | 1 refills | Status: DC

## 2020-06-26 MED ORDER — ESCITALOPRAM OXALATE 20 MG PO TABS: 1.0000 | ORAL_TABLET | Freq: Every day | ORAL | 1 refills | Status: DC

## 2020-06-26 MED ORDER — GABAPENTIN 100 MG PO CAPS: 300.0000 mg | ORAL_CAPSULE | Freq: Two times a day (BID) | ORAL | 1 refills | Status: DC

## 2020-06-26 NOTE — Patient Instructions (Signed)
Tinnitus Tinnitus refers to hearing a sound when there is no actual source for that sound. This is often described as ringing in the ears. However, people withthis condition may hear a variety of noises, in one ear or in both ears. The sounds of tinnitus can be soft, loud, or somewhere in between. Tinnitus can last for a few seconds or can be constant for days. It may go away without treatment and come back at various times. When tinnitus is constant or happens often, it can lead to other problems, such as trouble sleeping and troubleconcentrating. Almost everyone experiences tinnitus at some point. Tinnitus is not the same as hearing loss. Tinnitus that is long-lasting (chronic) or comes back often (recurs) may require medical attention. What are the causes? The cause of tinnitus is often not known. In some cases, it can result from: Exposure to loud noises from machinery, music, or other sources. An object (foreign body) stuck in the ear. Earwax buildup. Drinking alcohol or caffeine. Taking certain medicines. Age-related hearing loss. It may also be caused by medical conditions such as: Ear or sinus infections. Heart diseases or high blood pressure. Allergies. Mnire's disease. Thyroid problems. Tumors. A weak, bulging blood vessel (aneurysm) near the ear. What increases the risk? The following factors may make you more likely to develop this condition: Exposure to loud noises. Age. Tinnitus is more likely in older individuals. Using alcohol or tobacco. What are the signs or symptoms? The main symptom of tinnitus is hearing a sound when there is no source for that sound. It may sound like: Buzzing. Sizzling. Ringing. Blowing air. Hissing. Whistling. Other sounds may include: Roaring. Running water. A musical note. Tapping. Humming. Symptoms may affect only one ear (unilateral) or both ears (bilateral). How is this diagnosed? Tinnitus is diagnosed based on your symptoms,  your medical history, and a physical exam. Your health care provider may do a thorough hearing test (audiologic exam) if your tinnitus: Is unilateral. Causes hearing difficulties. Lasts 6 months or longer. You may work with a health care provider who specializes in hearing disorders (audiologist). You may be asked questions about your symptoms and how they affect your daily life. You may have other tests done, such as: CT scan. MRI. An imaging test of how blood flows through your blood vessels (angiogram). How is this treated? Treating an underlying medical condition can sometimes make tinnitus go away. If your tinnitus continues, other treatments may include: Therapy and counseling to help you manage the stress of living with tinnitus. Sound generators to mask the tinnitus. These include: Tabletop sound machines that play relaxing sounds to help you fall asleep. Wearable devices that fit in your ear and play sounds or music. Acoustic neural stimulation. This involves using headphones to listen to music that contains an auditory signal. Over time, listening to this signal may change some pathways in your brain and make you less sensitive to tinnitus. This treatment is used for very severe cases when no other treatment is working. Using hearing aids or cochlear implants if your tinnitus is related to hearing loss. Hearing aids are worn in the outer ear. Cochlear implants are surgically placed in the inner ear. Follow these instructions at home: Managing symptoms     When possible, avoid being in loud places and being exposed to loud sounds. Wear hearing protection, such as earplugs, when you are exposed to loud noises. Use a white noise machine, a humidifier, or other devices to mask the sound of tinnitus. Practice techniques  for reducing stress, such as meditation, yoga, or deep breathing. Work with your health care provider if you need help with managing stress. Sleep with your head  slightly raised. This may reduce the impact of tinnitus. General instructions Do not use stimulants, such as nicotine, alcohol, or caffeine. Talk with your health care provider about other stimulants to avoid. Stimulants are substances that can make you feel alert and attentive by increasing certain activities in the body (such as heart rate and blood pressure). These substances may make tinnitus worse. Take over-the-counter and prescription medicines only as told by your health care provider. Try to get plenty of sleep each night. Keep all follow-up visits. This is important. Contact a health care provider if: Your tinnitus continues for 3 weeks or longer without stopping. You develop sudden hearing loss. Your symptoms get worse or do not get better with home care. You feel you are not able to manage the stress of living with tinnitus. Get help right away if: You develop tinnitus after a head injury. You have tinnitus along with any of the following: Dizziness. Nausea and vomiting. Loss of balance. Sudden, severe headache. Vision changes. Facial weakness or weakness of arms or legs. These symptoms may represent a serious problem that is an emergency. Do not wait to see if the symptoms will go away. Get medical help right away. Call your local emergency services (911 in the [place] Do not drive yourself to the hospital. Summary Tinnitus refers to hearing a sound when there is no actual source for that sound. This is often described as ringing in the ears. Symptoms may affect only one ear (unilateral) or both ears (bilateral). Use a white noise machine, a humidifier, or other devices to mask the sound of tinnitus. Do not use stimulants, such as nicotine, alcohol, or caffeine. These substances may make tinnitus worse. This information is not intended to replace advice given to you by your health care provider. Make sure you discuss any questions you have with your healthcare  provider. Document Revised: 12/10/2019 Document Reviewed: 12/10/2019 Elsevier Patient Education  2022 [employer].

## 2020-06-26 NOTE — Progress Notes (Signed)
Subjective:    Patient ID: Manuel Galloway, male    DOB: 1965/11/28, 55 y.o.   MRN: [id number]  Manuel Galloway comes in today: Annual physical  Diagnosis and orders addressed:  1. Anxiety and depression We put him on lexapro in April due to depression and anxiety. He was already Wellbutrin daily.We also gave him a low dose xanax. He does not take xanax very often  . He says that lexapro has been a wonder drug. He denies nay sexual side effects.  Depression screen [facility] 2/9 06/26/2020 03/28/2020 07/19/2019  Decreased Interest 0 3 2  Down, Depressed, Hopeless 0 3 3  PHQ - 2 Score 0 6 5  Altered sleeping 1 0 0  Tired, decreased energy 1 3 3   Change in appetite 0 1 1  Feeling bad or failure about yourself  0 1 3  Trouble concentrating 1 3 3   Moving slowly or fidgety/restless 0 0 0  Suicidal thoughts 0 0 0  PHQ-9 Score 3 14 15   Difficult doing work/chores Not difficult at all Not difficult at all Not difficult at all  Some recent data might be hidden   GAD 7 : Generalized Anxiety Score 06/26/2020 03/28/2020 07/19/2019  Nervous, Anxious, on Edge 1 3 3   Control/stop worrying 1 3 3   Worry too much - different things 1 3 3   Trouble relaxing 1 3 3   Restless 0 0 0  Easily annoyed or irritable 0 0 3  Afraid - awful might happen 0 3 3  Total GAD 7 Score 4 15 18   Anxiety Difficulty Not difficult at all Not difficult at all Somewhat difficult      2. DDD (degenerative disc disease), lumbar He is on neurontin. Back pain and neck pain is tolerable. He says neck hurts worse if he sleeps in bed. Sleeps better on couch. Rates pain 3/10. Over activity increases pain.  3. Benign prostatic hyperplasia without lower urinary tract symptoms Is on flomax and denis any problems passing his water.  4. tinnitus Saw audiology and they saying hearing was fine. They suggest that he see an ENT.   Outpatient Encounter Medications as of 06/26/2020  Medication Sig   acetaminophen (TYLENOL) 500 MG tablet Take 1,000 mg  by mouth every 6 (six) hours as needed.   ALPRAZolam (XANAX) 0.5 MG tablet Take 0.5-1 tablets (0.25-0.5 mg total) by mouth 2 (two) times daily as needed for anxiety.   buPROPion (WELLBUTRIN XL) 300 MG 24 hr tablet TAKE 1 TABLET BY MOUTH EVERY DAY IN THE MORNING   escitalopram (LEXAPRO) 20 MG tablet TAKE 1 TABLET BY MOUTH EVERY DAY   fluticasone (FLONASE) 50 MCG/ACT nasal spray Place 2 sprays into both nostrils daily.   gabapentin (NEURONTIN) 100 MG capsule TAKE 3 CAPSULES (300 MG TOTAL) BY MOUTH 2 (TWO) TIMES DAILY.   ibuprofen (ADVIL,MOTRIN) 200 MG tablet Take 200 mg by mouth every 6 (six) hours as needed.   Omega-3 Fatty Acids (FISH OIL) 1200 MG CAPS Take by mouth.   tamsulosin (FLOMAX) 0.4 MG CAPS capsule TAKE 1 CAPSULE BY MOUTH EVERY DAY   [DISCONTINUED] Multiple Vitamin (MULTIVITAMIN ADULT PO) Take by mouth.   No facility-administered encounter medications on file as of 06/26/2020.       Review of Systems  Constitutional:  Negative for diaphoresis.  Eyes:  Negative for pain.  Respiratory:  Negative for shortness of breath.   Cardiovascular:  Negative for chest pain, palpitations and leg swelling.  Gastrointestinal:  Negative for abdominal pain.  Endocrine: Negative for polydipsia.  Skin:  Negative for rash.  Neurological:  Negative for dizziness, weakness and headaches.  Hematological:  Does not bruise/bleed easily.  All other systems reviewed and are negative.     Objective:   Physical Exam Vitals and nursing note reviewed.  Constitutional:      Appearance: Normal appearance. He is well-developed.  HENT:     Head: Normocephalic.     Nose: Nose normal.  Eyes:     Pupils: Pupils are equal, round, and reactive to light.  Neck:     Thyroid: No thyroid mass or thyromegaly.     Vascular: No carotid bruit or JVD.     Trachea: Phonation normal.  Cardiovascular:     Rate and Rhythm: Normal rate and regular rhythm.  Pulmonary:     Effort: Pulmonary effort is normal. No  respiratory distress.     Breath sounds: Normal breath sounds.  Abdominal:     General: Bowel sounds are normal.     Palpations: Abdomen is soft.     Tenderness: There is no abdominal tenderness.  Musculoskeletal:        General: Normal range of motion.     Cervical back: Normal range of motion and neck supple.  Lymphadenopathy:     Cervical: No cervical adenopathy.  Skin:    General: Skin is warm and dry.  Neurological:     Mental Status: He is alert and oriented to person, place, and time.  Psychiatric:        Behavior: Behavior normal.        Thought Content: Thought content normal.        Judgment: Judgment normal.   BP 123/73   Pulse 84   Temp 98.3 F (36.8 C) (Temporal)   Resp 20   Ht 6' 3"  (1.905 m)   Wt 266 lb (120.7 kg)   SpO2 92%   BMI 33.25 kg/m         Assessment & Plan:   Manuel Galloway comes in today with chief complaint of Annual Exam   Diagnosis and orders addressed:  1. Annual physical exam Labs pending - CBC with Differential/Platelet - CMP14+EGFR - Lipid panel  2. Anxiety and depression Continue stress management - escitalopram (LEXAPRO) 20 MG tablet; Take 1 tablet (20 mg total) by mouth daily.  Dispense: 90 tablet; Refill: 1 - buPROPion (WELLBUTRIN XL) 300 MG 24 hr tablet; TAKE 1 TABLET BY MOUTH EVERY DAY IN THE MORNING  Dispense: 90 tablet; Refill: 1  3. DDD (degenerative disc disease), lumbar Back stretches Moist heat - gabapentin (NEURONTIN) 100 MG capsule; Take 3 capsules (300 mg total) by mouth 2 (two) times daily.  Dispense: 180 capsule; Refill: 1  4. Benign prostatic hyperplasia without lower urinary tract symptoms Continue flomax as prescribed Referral to - tamsulosin (FLOMAX) 0.4 MG CAPS capsule; Take 1 capsule (0.4 mg total) by mouth daily.  Dispense: 90 capsule; Refill: 1   5. Tinnitus of both ears Referral to ENT - Ambulatory referral to ENT  6 Cervical pain Moist heat rest   Labs pending Health Maintenance  reviewed Diet and exercise encouraged  Follow up plan: 6 months   Peters, FNP

## 2020-06-27 LAB — LIPID PANEL
Chol/HDL Ratio: 5.3 ratio — ABNORMAL HIGH (ref 0.0–5.0)
Cholesterol, Total: 217 mg/dL — ABNORMAL HIGH (ref 100–199)
HDL: 41 mg/dL (ref >39)
LDL Chol Calc (NIH): 124 mg/dL — ABNORMAL HIGH (ref 0–99)
Triglycerides: 293 mg/dL — ABNORMAL HIGH (ref 0–149)
VLDL Cholesterol Cal: 52 mg/dL — ABNORMAL HIGH (ref 5–40)

## 2020-06-27 LAB — CBC WITH DIFFERENTIAL/PLATELET
Basophils Absolute: 0.1 10*3/uL (ref 0.0–0.2)
Basos: 1 %
EOS (ABSOLUTE): 0.3 10*3/uL (ref 0.0–0.4)
Eos: 4 %
Hematocrit: 42.2 % (ref 37.5–51.0)
Hemoglobin: 14.6 g/dL (ref 13.0–17.7)
Immature Grans (Abs): 0 10*3/uL (ref 0.0–0.1)
Immature Granulocytes: 0 %
Lymphocytes Absolute: 3 10*3/uL (ref 0.7–3.1)
Lymphs: 36 %
MCH: 30.8 pg (ref 26.6–33.0)
MCHC: 34.6 g/dL (ref 31.5–35.7)
MCV: 89 fL (ref 79–97)
Monocytes Absolute: 0.6 10*3/uL (ref 0.1–0.9)
Monocytes: 7 %
Neutrophils Absolute: 4.4 10*3/uL (ref 1.4–7.0)
Neutrophils: 52 %
Platelets: 203 10*3/uL (ref 150–450)
RBC: 4.74 x10E6/uL (ref 4.14–5.80)
RDW: 12.8 % (ref 11.6–15.4)
WBC: 8.4 10*3/uL (ref 3.4–10.8)

## 2020-06-27 LAB — CMP14+EGFR
ALT: 21 IU/L (ref 0–44)
AST: 17 IU/L (ref 0–40)
Albumin/Globulin Ratio: 1.4 (ref 1.2–2.2)
Albumin: 4.3 g/dL (ref 3.8–4.9)
Alkaline Phosphatase: 56 IU/L (ref 44–121)
BUN/Creatinine Ratio: 15 (ref 9–20)
BUN: 14 mg/dL (ref 6–24)
Bilirubin Total: 0.5 mg/dL (ref 0.0–1.2)
CO2: 21 mmol/L (ref 20–29)
Calcium: 9.2 mg/dL (ref 8.7–10.2)
Chloride: 103 mmol/L (ref 96–106)
Creatinine, Ser: 0.91 mg/dL (ref 0.76–1.27)
Globulin, Total: 3 g/dL (ref 1.5–4.5)
Glucose: 82 mg/dL (ref 65–99)
Potassium: 4.3 mmol/L (ref 3.5–5.2)
Sodium: 138 mmol/L (ref 134–144)
Total Protein: 7.3 g/dL (ref 6.0–8.5)
eGFR: 98 mL/min/{1.73_m2} (ref >59)

## 2020-10-11 ENCOUNTER — Other Ambulatory Visit: Payer: Self-pay | Admitting: Nurse Practitioner

## 2020-10-11 DIAGNOSIS — M5136 Other intervertebral disc degeneration, lumbar region: Secondary | ICD-10-CM

## 2020-11-18 ENCOUNTER — Other Ambulatory Visit: Payer: Self-pay | Admitting: Nurse Practitioner

## 2020-11-18 DIAGNOSIS — M5136 Other intervertebral disc degeneration, lumbar region: Secondary | ICD-10-CM

## 2020-12-26 ENCOUNTER — Ambulatory Visit (INDEPENDENT_AMBULATORY_CARE_PROVIDER_SITE_OTHER): Payer: BC Managed Care – PPO | Admitting: Nurse Practitioner

## 2020-12-26 ENCOUNTER — Encounter: Payer: Self-pay | Admitting: Nurse Practitioner

## 2020-12-26 VITALS — BP 142/84 | HR 70 | Temp 98.2°F | Resp 20 | Ht 75.0 in | Wt 274.0 lb

## 2020-12-26 DIAGNOSIS — F419 Anxiety disorder, unspecified: Secondary | ICD-10-CM

## 2020-12-26 DIAGNOSIS — N400 Enlarged prostate without lower urinary tract symptoms: Secondary | ICD-10-CM

## 2020-12-26 DIAGNOSIS — M5136 Other intervertebral disc degeneration, lumbar region: Secondary | ICD-10-CM | POA: Diagnosis not present

## 2020-12-26 DIAGNOSIS — Z23 Encounter for immunization: Secondary | ICD-10-CM

## 2020-12-26 DIAGNOSIS — Z6834 Body mass index (BMI) 34.0-34.9, adult: Secondary | ICD-10-CM | POA: Diagnosis not present

## 2020-12-26 DIAGNOSIS — N401 Enlarged prostate with lower urinary tract symptoms: Secondary | ICD-10-CM

## 2020-12-26 DIAGNOSIS — F32A Depression, unspecified: Secondary | ICD-10-CM

## 2020-12-26 DIAGNOSIS — R350 Frequency of micturition: Secondary | ICD-10-CM

## 2020-12-26 MED ORDER — PREDNISONE 10 MG (21) PO TBPK: ORAL_TABLET | ORAL | 0 refills | Status: DC

## 2020-12-26 MED ORDER — BUPROPION HCL ER (XL) 300 MG PO TB24: ORAL_TABLET | ORAL | 1 refills | Status: DC

## 2020-12-26 MED ORDER — TAMSULOSIN HCL 0.4 MG PO CAPS: 0.4000 mg | ORAL_CAPSULE | Freq: Every day | ORAL | 1 refills | Status: DC

## 2020-12-26 MED ORDER — KETOROLAC TROMETHAMINE 60 MG/2ML IM SOLN
60.0000 mg | Freq: Once | INTRAMUSCULAR | Status: AC
  Administered 2020-12-26: 60 mg via INTRAMUSCULAR

## 2020-12-26 MED ORDER — ESCITALOPRAM OXALATE 20 MG PO TABS: 20.0000 mg | ORAL_TABLET | Freq: Every day | ORAL | 1 refills | Status: DC

## 2020-12-26 MED ORDER — ALPRAZOLAM 0.5 MG PO TABS: 0.2500 mg | ORAL_TABLET | Freq: Two times a day (BID) | ORAL | 2 refills | Status: DC | PRN

## 2020-12-26 MED ORDER — GABAPENTIN 100 MG PO CAPS: 300.0000 mg | ORAL_CAPSULE | Freq: Two times a day (BID) | ORAL | 1 refills | Status: DC

## 2020-12-26 NOTE — Patient Instructions (Signed)

## 2020-12-26 NOTE — Progress Notes (Signed)
Subjective:    Patient ID: Manuel Galloway, male    DOB: Jul 14, 1965, 55 y.o.   MRN: [id number]   Chief Complaint: Medical Management of Chronic Issues    HPI:  1. Anxiety and depression He is ding well. He is on combination of lexapro and wellbutrin along with xanax that he rarely takes. Depression screen [facility] 2/9 12/26/2020 06/26/2020 03/28/2020  Decreased Interest 0 0 3  Down, Depressed, Hopeless 0 0 3  PHQ - 2 Score 0 0 6  Altered sleeping 0 1 0  Tired, decreased energy 0 1 3  Change in appetite 0 0 1  Feeling bad or failure about yourself  0 0 1  Trouble concentrating 0 1 3  Moving slowly or fidgety/restless 0 0 0  Suicidal thoughts 0 0 0  PHQ-9 Score 0 3 14  Difficult doing work/chores Not difficult at all Not difficult at all Not difficult at all  Some recent data might be hidden   GAD 7 : Generalized Anxiety Score 12/26/2020 06/26/2020 03/28/2020 07/19/2019  Nervous, Anxious, on Edge 0 1 3 3   Control/stop worrying 0 1 3 3   Worry too much - different things 0 1 3 3   Trouble relaxing 0 1 3 3   Restless 0 0 0 0  Easily annoyed or irritable 0 0 0 3  Afraid - awful might happen 0 0 3 3  Total GAD 7 Score 0 4 15 18   Anxiety Difficulty Not difficult at all Not difficult at all Not difficult at all Somewhat difficult      2. DDD (degenerative disc disease), lumbar He is doing well. Rates pain 3/10 but can go up th 8/10 when standing or driving. They want him to have surgery but he doe sot want to.   3. Benign prostatic hyperplasia without lower urinary tract symptoms No problems passing water. Is on flomax daily.  4. BMI 33.0-33.9 Weight is up 8lbs Wt Readings from Last 3 Encounters:  12/26/20 274 lb (124.3 kg)  06/26/20 266 lb (120.7 kg)  03/28/20 265 lb (120.2 kg)   BMI Readings from Last 3 Encounters:  12/26/20 34.25 kg/m  06/26/20 33.25 kg/m  03/28/20 33.12 kg/m       Outpatient Encounter Medications as of 12/26/2020  Medication Sig   acetaminophen (TYLENOL)  500 MG tablet Take 1,000 mg by mouth every 6 (six) hours as needed.   ALPRAZolam (XANAX) 0.5 MG tablet Take 0.5-1 tablets (0.25-0.5 mg total) by mouth 2 (two) times daily as needed for anxiety.   buPROPion (WELLBUTRIN XL) 300 MG 24 hr tablet TAKE 1 TABLET BY MOUTH EVERY DAY IN THE MORNING   escitalopram (LEXAPRO) 20 MG tablet Take 1 tablet (20 mg total) by mouth daily.   fluticasone (FLONASE) 50 MCG/ACT nasal spray Place 2 sprays into both nostrils daily.   gabapentin (NEURONTIN) 100 MG capsule TAKE 3 CAPSULES (300 MG TOTAL) BY MOUTH 2 (TWO) TIMES DAILY.   ibuprofen (ADVIL,MOTRIN) 200 MG tablet Take 200 mg by mouth every 6 (six) hours as needed.   Omega-3 Fatty Acids (FISH OIL) 1200 MG CAPS Take by mouth.   tamsulosin (FLOMAX) 0.4 MG CAPS capsule Take 1 capsule (0.4 mg total) by mouth daily.   No facility-administered encounter medications on file as of 12/26/2020.    Past Surgical History:  Procedure Laterality Date   COLONOSCOPY     KNEE ARTHROSCOPY  1986 and 2007   left   KNEE ARTHROSCOPY  1999   right   MOUTH SURGERY  wisdom tooth removed    NASAL SEPTUM SURGERY  2005   REPLACEMENT TOTAL KNEE Left    TOTAL KNEE ARTHROPLASTY  07/14/2011   Procedure: TOTAL KNEE ARTHROPLASTY;  Surgeon: Gearlean Alf, MD;  Location: [facility];  Service: Orthopedics;  Laterality: Left;   TOTAL KNEE ARTHROPLASTY Right 07/16/2013   Procedure: RIGHT TOTAL KNEE ARTHROPLASTY;  Surgeon: Gearlean Alf, MD;  Location: [facility];  Service: Orthopedics;  Laterality: Right;   UPPER GASTROINTESTINAL ENDOSCOPY     UPPER GI ENDOSCOPY      Family History  Problem Relation Age of Onset   Cancer Father    Colon cancer Father 36   Colon polyps Neg Hx    Esophageal cancer Neg Hx    Rectal cancer Neg Hx    Stomach cancer Neg Hx     New complaints: None  today  Social history: Lives with wife  Controlled substance contract: n/a     Review of Systems  Constitutional:  Negative for diaphoresis.  Eyes:   Negative for pain.  Respiratory:  Negative for shortness of breath.   Cardiovascular:  Negative for chest pain, palpitations and leg swelling.  Gastrointestinal:  Negative for abdominal pain.  Endocrine: Negative for polydipsia.  Musculoskeletal:  Positive for back pain.  Skin:  Negative for rash.  Neurological:  Negative for dizziness, weakness and headaches.  Hematological:  Does not bruise/bleed easily.  All other systems reviewed and are negative.     Objective:   Physical Exam Vitals and nursing note reviewed.  Constitutional:      Appearance: Normal appearance. He is well-developed.  HENT:     Head: Normocephalic.     Nose: Nose normal.     Mouth/Throat:     Mouth: Mucous membranes are moist.     Pharynx: Oropharynx is clear.  Eyes:     Pupils: Pupils are equal, round, and reactive to light.  Neck:     Thyroid: No thyroid mass or thyromegaly.     Vascular: No carotid bruit or JVD.     Trachea: Phonation normal.  Cardiovascular:     Rate and Rhythm: Normal rate and regular rhythm.  Pulmonary:     Effort: Pulmonary effort is normal. No respiratory distress.     Breath sounds: Normal breath sounds.  Abdominal:     General: Bowel sounds are normal.     Palpations: Abdomen is soft.     Tenderness: There is no abdominal tenderness.  Musculoskeletal:        General: Normal range of motion.     Cervical back: Normal range of motion and neck supple.     Comments: Decrease ROM of lumbar spine with increase in pain on movement in amy direction. Motor strength and sensation distally intact (-) SLR  Lymphadenopathy:     Cervical: No cervical adenopathy.  Skin:    General: Skin is warm and dry.  Neurological:     Mental Status: He is alert and oriented to person, place, and time.  Psychiatric:        Behavior: Behavior normal.        Thought Content: Thought content normal.        Judgment: Judgment normal.    BP (!) 142/84   Pulse 70   Temp 98.2 F (36.8 C)  (Temporal)   Resp 20   Ht 6\' 3"  (1.905 m)   Wt 274 lb (124.3 kg)   SpO2 95%   BMI 34.25 kg/m  Assessment & Plan:  Brenton Grills in today with chief complaint of Medical Management of Chronic Issues   1. Anxiety and depression Stress management - escitalopram (LEXAPRO) 20 MG tablet; Take 1 tablet (20 mg total) by mouth daily.  Dispense: 90 tablet; Refill: 1 - buPROPion (WELLBUTRIN XL) 300 MG 24 hr tablet; TAKE 1 TABLET BY MOUTH EVERY DAY IN THE MORNING  Dispense: 90 tablet; Refill: 1 - ALPRAZolam (XANAX) 0.5 MG tablet; Take 0.5-1 tablets (0.25-0.5 mg total) by mouth 2 (two) times daily as needed for anxiety.  Dispense: 30 tablet; Refill: 2  2. DDD (degenerative disc disease), lumbar Moist heat  rest - ketorolac (TORADOL) injection 60 mg - gabapentin (NEURONTIN) 100 MG capsule; Take 3 capsules (300 mg total) by mouth 2 (two) times daily.  Dispense: 180 capsule; Refill: 1  3. Benign prostatic hyperplasia without lower urinary tract symptoms Continue flomax as ordered - tamsulosin (FLOMAX) 0.4 MG CAPS capsule; Take 1 capsule (0.4 mg total) by mouth daily.  Dispense: 90 capsule; Refill: 1  4. BMI 34.0-34.9,adult Discussed diet and exercise for person with BMI >25 Will recheck weight in 3-6 months      The above assessment and management plan was discussed with the patient. The patient verbalized understanding of and has agreed to the management plan. Patient is aware to call the clinic if symptoms persist or worsen. Patient is aware when to return to the clinic for a follow-up visit. Patient educated on when it is appropriate to go to the emergency department.   Mary-Margaret Hassell Done, FNP

## 2020-12-29 DIAGNOSIS — Z23 Encounter for immunization: Secondary | ICD-10-CM | POA: Diagnosis not present

## 2021-09-18 ENCOUNTER — Other Ambulatory Visit: Payer: Self-pay | Admitting: Nurse Practitioner

## 2021-09-18 DIAGNOSIS — F32A Depression, unspecified: Secondary | ICD-10-CM

## 2022-01-17 ENCOUNTER — Other Ambulatory Visit: Payer: Self-pay | Admitting: Nurse Practitioner

## 2022-01-17 DIAGNOSIS — N401 Enlarged prostate with lower urinary tract symptoms: Secondary | ICD-10-CM

## 2022-01-26 ENCOUNTER — Ambulatory Visit (INDEPENDENT_AMBULATORY_CARE_PROVIDER_SITE_OTHER): Payer: BC Managed Care – PPO | Admitting: Nurse Practitioner

## 2022-01-26 ENCOUNTER — Ambulatory Visit (INDEPENDENT_AMBULATORY_CARE_PROVIDER_SITE_OTHER): Payer: BC Managed Care – PPO

## 2022-01-26 ENCOUNTER — Encounter: Payer: Self-pay | Admitting: Nurse Practitioner

## 2022-01-26 VITALS — BP 121/79 | HR 79 | Temp 98.1°F | Resp 20 | Ht 75.0 in | Wt 268.0 lb

## 2022-01-26 DIAGNOSIS — B009 Herpesviral infection, unspecified: Secondary | ICD-10-CM

## 2022-01-26 DIAGNOSIS — K591 Functional diarrhea: Secondary | ICD-10-CM

## 2022-01-26 DIAGNOSIS — F419 Anxiety disorder, unspecified: Secondary | ICD-10-CM

## 2022-01-26 DIAGNOSIS — K5900 Constipation, unspecified: Secondary | ICD-10-CM | POA: Diagnosis not present

## 2022-01-26 DIAGNOSIS — F32A Depression, unspecified: Secondary | ICD-10-CM

## 2022-01-26 MED ORDER — ESCITALOPRAM OXALATE 20 MG PO TABS: 20.0000 mg | ORAL_TABLET | Freq: Every day | ORAL | 1 refills | Status: DC

## 2022-01-26 MED ORDER — VALACYCLOVIR HCL 1 G PO TABS: 1000.0000 mg | ORAL_TABLET | Freq: Three times a day (TID) | ORAL | 0 refills | Status: DC

## 2022-01-26 NOTE — Progress Notes (Signed)
Subjective:    Patient ID: Manuel Galloway, male    DOB: October 09, 1965, 57 y.o.   MRN: [id number]   Chief Complaint: Diarrhea, stomach cramping (For 3 weeks. Cramps right after eating), and Area on right finger   Diarrhea    Patient come sin with 3 complaints: - Has had diarrhea for 3 weeks. Has cramps when he has to have a bowel movement. Goes to the restroom 1x a day now. Was going 4-5 x a day. Has been taking imodium which has helped. His abdominal pain is in the left upper quadrant. - herpes whitlow- when he gets sick it breaks out on his finger. Popped up last night. - him and his wife separated and he is having a rough time. He is still on wellbutrin but he stooped his lexapro over a year ago and feels like he needs to go back on it now.    01/26/2022   10:02 AM 12/26/2020    4:15 PM 06/26/2020    4:01 PM  Depression screen PHQ 2/9  Decreased Interest 2 0 0  Down, Depressed, Hopeless 2 0 0  PHQ - 2 Score 4 0 0  Altered sleeping 0 0 1  Tired, decreased energy 1 0 1  Change in appetite 0 0 0  Feeling bad or failure about yourself  3 0 0  Trouble concentrating 1 0 1  Moving slowly or fidgety/restless 0 0 0  Suicidal thoughts 2 0 0  PHQ-9 Score 11 0 3  Difficult doing work/chores Somewhat difficult Not difficult at all Not difficult at all       Review of Systems  Gastrointestinal:  Positive for diarrhea.       Objective:   Physical Exam Constitutional:      Appearance: Normal appearance.  Cardiovascular:     Rate and Rhythm: Normal rate and regular rhythm.  Pulmonary:     Effort: Pulmonary effort is normal.     Breath sounds: Normal breath sounds.  Abdominal:     General: Abdomen is flat. Bowel sounds are normal.     Palpations: Abdomen is soft.     Tenderness: There is abdominal tenderness (mild LUQ).  Skin:    General: Skin is warm.     Comments: Vesicular lesion on right middle finger- palm surface  Neurological:     General: No focal deficit present.      Mental Status: He is alert and oriented to person, place, and time.  Psychiatric:        Mood and Affect: Mood normal.        Behavior: Behavior normal.     BP 121/79   Pulse 79   Temp 98.1 F (36.7 C) (Temporal)   Resp 20   Ht '6\' 3"'$  (1.905 m)   Wt 268 lb (121.6 kg)   SpO2 95%   BMI 33.50 kg/m        Assessment & Plan:   Brenton Grills in today with chief complaint of Diarrhea, stomach cramping (For 3 weeks. Cramps right after eating), and Area on right finger   1. Herpes Do not pick at lesion - valACYclovir (VALTREX) 1000 MG tablet; Take 1 tablet (1,000 mg total) by mouth 3 (three) times daily.  Dispense: 21 tablet; Refill: 0  2. Anxiety and depression Stress management - escitalopram (LEXAPRO) 20 MG tablet; Take 1 tablet (20 mg total) by mouth daily.  Dispense: 90 tablet; Refill: 1  3. Functional diarrhea - DG Abd 1 View  4. Constipation, unspecified constipation type Force fluids Increase fiber in diet Miralax daily    The above assessment and management plan was discussed with the patient. The patient verbalized understanding of and has agreed to the management plan. Patient is aware to call the clinic if symptoms persist or worsen. Patient is aware when to return to the clinic for a follow-up visit. Patient educated on when it is appropriate to go to the emergency department.   Mary-Margaret Hassell Done, FNP

## 2022-01-26 NOTE — Patient Instructions (Signed)

## 2022-01-27 ENCOUNTER — Other Ambulatory Visit: Payer: Self-pay | Admitting: Nurse Practitioner

## 2022-01-27 DIAGNOSIS — R9389 Abnormal findings on diagnostic imaging of other specified body structures: Secondary | ICD-10-CM

## 2022-05-03 ENCOUNTER — Other Ambulatory Visit: Payer: Self-pay | Admitting: Nurse Practitioner

## 2022-05-03 DIAGNOSIS — F419 Anxiety disorder, unspecified: Secondary | ICD-10-CM

## 2022-05-20 ENCOUNTER — Encounter: Payer: Self-pay | Admitting: Nurse Practitioner

## 2022-05-20 ENCOUNTER — Encounter: Payer: Self-pay | Admitting: Internal Medicine

## 2022-05-20 ENCOUNTER — Ambulatory Visit (INDEPENDENT_AMBULATORY_CARE_PROVIDER_SITE_OTHER): Payer: BC Managed Care – PPO | Admitting: Nurse Practitioner

## 2022-05-20 VITALS — BP 142/82 | HR 73 | Temp 98.2°F | Resp 20 | Ht 75.0 in | Wt 270.0 lb

## 2022-05-20 DIAGNOSIS — M5136 Other intervertebral disc degeneration, lumbar region: Secondary | ICD-10-CM

## 2022-05-20 DIAGNOSIS — F419 Anxiety disorder, unspecified: Secondary | ICD-10-CM | POA: Diagnosis not present

## 2022-05-20 DIAGNOSIS — F32A Depression, unspecified: Secondary | ICD-10-CM

## 2022-05-20 DIAGNOSIS — Z6834 Body mass index (BMI) 34.0-34.9, adult: Secondary | ICD-10-CM

## 2022-05-20 DIAGNOSIS — G479 Sleep disorder, unspecified: Secondary | ICD-10-CM

## 2022-05-20 DIAGNOSIS — N401 Enlarged prostate with lower urinary tract symptoms: Secondary | ICD-10-CM

## 2022-05-20 DIAGNOSIS — Z86010 Personal history of colonic polyps: Secondary | ICD-10-CM

## 2022-05-20 DIAGNOSIS — N400 Enlarged prostate without lower urinary tract symptoms: Secondary | ICD-10-CM | POA: Diagnosis not present

## 2022-05-20 LAB — PSA, TOTAL AND FREE

## 2022-05-20 MED ORDER — GABAPENTIN 100 MG PO CAPS: 300.0000 mg | ORAL_CAPSULE | Freq: Two times a day (BID) | ORAL | 1 refills | Status: DC

## 2022-05-20 MED ORDER — BUPROPION HCL ER (XL) 300 MG PO TB24: ORAL_TABLET | ORAL | 1 refills | Status: DC

## 2022-05-20 MED ORDER — TAMSULOSIN HCL 0.4 MG PO CAPS
0.4000 mg | ORAL_CAPSULE | Freq: Every day | ORAL | 1 refills | Status: DC
Start: 2022-05-20 — End: 2022-12-02

## 2022-05-20 MED ORDER — ALPRAZOLAM 0.5 MG PO TABS: 0.2500 mg | ORAL_TABLET | Freq: Two times a day (BID) | ORAL | 2 refills | Status: DC | PRN

## 2022-05-20 NOTE — Addendum Note (Signed)
Addended by: Cleda Daub on: 05/20/2022 09:35 AM   Modules accepted: Orders

## 2022-05-20 NOTE — Progress Notes (Signed)
Subjective:    Patient ID: Manuel Galloway, male    DOB: [birth year], 57 y.o.   MRN: [id number]   Chief Complaint: medical management of chronic issues     HPI:  Manuel Galloway is a 57 y.o. who identifies as a male who was assigned male at birth.   Social history: Lives with: him and his wife recently separated Work history: [employer]   Comes in today for follow up of the following chronic medical issues:  1. Anxiety and depression Patient is currently on wellbutrin and lexapro- is helpful. Is on xanax but does not take very often.    05/20/2022    9:06 AM 01/26/2022   10:03 AM 12/26/2020    4:15 PM 06/26/2020    4:01 PM  GAD 7 : Generalized Anxiety Score  Nervous, Anxious, on Edge 1 2 0 1  Control/stop worrying 1 3 0 1  Worry too much - different things 1 3 0 1  Trouble relaxing 1 3 0 1  Restless 0 2 0 0  Easily annoyed or irritable 1 3 0 0  Afraid - awful might happen 1 2 0 0  Total GAD 7 Score 6 18 0 4  Anxiety Difficulty Somewhat difficult Somewhat difficult Not difficult at all Not difficult at all      2. Benign prostatic hyperplasia without lower urinary tract symptoms No voiding issues  3. DDD (degenerative disc disease), lumbar Tolerable- is on no pain meds  4. Difficulty sleeping Sleeping better  5. BMI 34.0-34.9,adult No recent weight changes BMI Readings from Last 3 Encounters:  05/20/22 33.75 kg/m  01/26/22 33.50 kg/m  12/26/20 34.25 kg/m   Wt Readings from Last 3 Encounters:  05/20/22 270 lb (122.5 kg)  01/26/22 268 lb (121.6 kg)  12/26/20 274 lb (124.3 kg)     6. Hx of adenomatous polyp of colon Had colonoscopy on 04/30/19 and was due to repeat in 3 years due to polyps found. Has not had repeated currently.   New complaints: None today  Allergies  Allergen Reactions   Fish Allergy Shortness Of Breath and Swelling   Iodine Other (See Comments)    unkown   Outpatient Encounter Medications as of 05/20/2022  Medication Sig   acetaminophen  (TYLENOL) 500 MG tablet Take 1,000 mg by mouth every 6 (six) hours as needed.   ALPRAZolam (XANAX) 0.5 MG tablet Take 0.5-1 tablets (0.25-0.5 mg total) by mouth 2 (two) times daily as needed for anxiety. (Patient not taking: Reported on 01/26/2022)   buPROPion (WELLBUTRIN XL) 300 MG 24 hr tablet TAKE 1 TABLET BY MOUTH EVERY DAY IN THE MORNING   escitalopram (LEXAPRO) 20 MG tablet Take 1 tablet (20 mg total) by mouth daily.   fluticasone (FLONASE) 50 MCG/ACT nasal spray Place 2 sprays into both nostrils daily.   gabapentin (NEURONTIN) 100 MG capsule Take 3 capsules (300 mg total) by mouth 2 (two) times daily.   ibuprofen (ADVIL,MOTRIN) 200 MG tablet Take 200 mg by mouth every 6 (six) hours as needed.   Omega-3 Fatty Acids (FISH OIL) 1200 MG CAPS Take by mouth.   tamsulosin (FLOMAX) 0.4 MG CAPS capsule Take 1 capsule (0.4 mg total) by mouth daily.   valACYclovir (VALTREX) 1000 MG tablet Take 1 tablet (1,000 mg total) by mouth 3 (three) times daily.   No facility-administered encounter medications on file as of 05/20/2022.    Past Surgical History:  Procedure Laterality Date   COLONOSCOPY     KNEE ARTHROSCOPY  1986 and 2007   left   KNEE ARTHROSCOPY  1999   right   MOUTH SURGERY     wisdom tooth removed    NASAL SEPTUM SURGERY  2005   REPLACEMENT TOTAL KNEE Left    TOTAL KNEE ARTHROPLASTY  07/14/2011   Procedure: TOTAL KNEE ARTHROPLASTY;  Surgeon: Loanne Drilling, MD;  Location: [facility];  Service: Orthopedics;  Laterality: Left;   TOTAL KNEE ARTHROPLASTY Right 07/16/2013   Procedure: RIGHT TOTAL KNEE ARTHROPLASTY;  Surgeon: Loanne Drilling, MD;  Location: [facility];  Service: Orthopedics;  Laterality: Right;   UPPER GASTROINTESTINAL ENDOSCOPY     UPPER GI ENDOSCOPY      Family History  Problem Relation Age of Onset   Cancer Father    Colon cancer Father 66   Colon polyps Neg Hx    Esophageal cancer Neg Hx    Rectal cancer Neg Hx    Stomach cancer Neg Hx       Controlled substance  contract: 05/20/22 drug screen  today      Review of Systems  Constitutional:  Negative for diaphoresis.  Eyes:  Negative for pain.  Respiratory:  Negative for shortness of breath.   Cardiovascular:  Negative for chest pain, palpitations and leg swelling.  Gastrointestinal:  Negative for abdominal pain.  Endocrine: Negative for polydipsia.  Skin:  Negative for rash.  Neurological:  Negative for dizziness, weakness and headaches.  Hematological:  Does not bruise/bleed easily.  All other systems reviewed and are negative.      Objective:   Physical Exam Vitals and nursing note reviewed.  Constitutional:      Appearance: Normal appearance. He is well-developed.  HENT:     Head: Normocephalic.     Nose: Nose normal.     Mouth/Throat:     Mouth: Mucous membranes are moist.     Pharynx: Oropharynx is clear.  Eyes:     Pupils: Pupils are equal, round, and reactive to light.  Neck:     Thyroid: No thyroid mass or thyromegaly.     Vascular: No carotid bruit or JVD.     Trachea: Phonation normal.  Cardiovascular:     Rate and Rhythm: Normal rate and regular rhythm.  Pulmonary:     Effort: Pulmonary effort is normal. No respiratory distress.     Breath sounds: Normal breath sounds.  Abdominal:     General: Bowel sounds are normal.     Palpations: Abdomen is soft.     Tenderness: There is no abdominal tenderness.  Musculoskeletal:        General: Normal range of motion.     Cervical back: Normal range of motion and neck supple.  Lymphadenopathy:     Cervical: No cervical adenopathy.  Skin:    General: Skin is warm and dry.  Neurological:     Mental Status: He is alert and oriented to person, place, and time.  Psychiatric:        Behavior: Behavior normal.        Thought Content: Thought content normal.        Judgment: Judgment normal.     BP (!) 142/82   Pulse 73   Temp 98.2 F (36.8 C) (Temporal)   Resp 20   Ht 6\' 3"  (1.905 m)   Wt 270 lb (122.5 kg)   SpO2 96%    BMI 33.75 kg/m        Assessment & Plan:   Manuel Galloway comes in today  with chief complaint of Medical Management of Chronic Issues   Diagnosis and orders addressed:  1. Anxiety and depression Stress management - ToxASSURE Select 13 (MW), Urine - ALPRAZolam (XANAX) 0.5 MG tablet; Take 0.5-1 tablets (0.25-0.5 mg total) by mouth 2 (two) times daily as needed for anxiety.  Dispense: 30 tablet; Refill: 2 - buPROPion (WELLBUTRIN XL) 300 MG 24 hr tablet; TAKE 1 TABLET BY MOUTH EVERY DAY IN THE MORNING  Dispense: 90 tablet; Refill: 1  2. Benign prostatic hyperplasia without lower urinary tract symptoms Report any voiding issues  3. DDD (degenerative disc disease), lumbar Moist heat - gabapentin (NEURONTIN) 100 MG capsule; Take 3 capsules (300 mg total) by mouth 2 (two) times daily.  Dispense: 180 capsule; Refill: 1  4. Difficulty sleeping Bedtime routine  5. BMI 34.0-34.9,adult Discussed diet and exercise for person with BMI >25 Will recheck weight in 3-6 months   6. Hx of adenomatous polyp of colon Patient will contact [facility] for repeat colonoscopy   Labs pending Health Maintenance reviewed Diet and exercise encouraged  Follow up plan: 6 months   Mary-Margaret Daphine Deutscher, FNP

## 2022-05-20 NOTE — Patient Instructions (Signed)

## 2022-05-21 LAB — CBC WITH DIFFERENTIAL/PLATELET
Basophils Absolute: 0.1 10*3/uL (ref 0.0–0.2)
Basos: 1 %
EOS (ABSOLUTE): 0.3 10*3/uL (ref 0.0–0.4)
Eos: 6 %
Hematocrit: 45 % (ref 37.5–51.0)
Hemoglobin: 15.5 g/dL (ref 13.0–17.7)
Immature Grans (Abs): 0 10*3/uL (ref 0.0–0.1)
Immature Granulocytes: 0 %
Lymphocytes Absolute: 2 10*3/uL (ref 0.7–3.1)
Lymphs: 34 %
MCH: 30 pg (ref 26.6–33.0)
MCHC: 34.4 g/dL (ref 31.5–35.7)
MCV: 87 fL (ref 79–97)
Monocytes Absolute: 0.4 10*3/uL (ref 0.1–0.9)
Monocytes: 8 %
Neutrophils Absolute: 3 10*3/uL (ref 1.4–7.0)
Neutrophils: 51 %
Platelets: 201 10*3/uL (ref 150–450)
RBC: 5.17 x10E6/uL (ref 4.14–5.80)
RDW: 12.2 % (ref 11.6–15.4)
WBC: 5.8 10*3/uL (ref 3.4–10.8)

## 2022-05-21 LAB — CMP14+EGFR
ALT: 35 IU/L (ref 0–44)
AST: 24 IU/L (ref 0–40)
Albumin/Globulin Ratio: 1.6 (ref 1.2–2.2)
Albumin: 4.6 g/dL (ref 3.8–4.9)
Alkaline Phosphatase: 63 IU/L (ref 44–121)
BUN/Creatinine Ratio: 12 (ref 9–20)
BUN: 12 mg/dL (ref 6–24)
Bilirubin Total: 0.5 mg/dL (ref 0.0–1.2)
CO2: 19 mmol/L — ABNORMAL LOW (ref 20–29)
Calcium: 9.4 mg/dL (ref 8.7–10.2)
Chloride: 104 mmol/L (ref 96–106)
Creatinine, Ser: 0.99 mg/dL (ref 0.76–1.27)
Globulin, Total: 2.8 g/dL (ref 1.5–4.5)
Glucose: 115 mg/dL — ABNORMAL HIGH (ref 70–99)
Potassium: 4.4 mmol/L (ref 3.5–5.2)
Sodium: 139 mmol/L (ref 134–144)
Total Protein: 7.4 g/dL (ref 6.0–8.5)
eGFR: 88 mL/min/{1.73_m2} (ref >59)

## 2022-05-21 LAB — LIPID PANEL
Chol/HDL Ratio: 5 ratio (ref 0.0–5.0)
Cholesterol, Total: 213 mg/dL — ABNORMAL HIGH (ref 100–199)
HDL: 43 mg/dL (ref >39)
LDL Chol Calc (NIH): 139 mg/dL — ABNORMAL HIGH (ref 0–99)
Triglycerides: 171 mg/dL — ABNORMAL HIGH (ref 0–149)
VLDL Cholesterol Cal: 31 mg/dL (ref 5–40)

## 2022-05-21 LAB — PSA, TOTAL AND FREE
PSA, Free Pct: 29.2 %
Prostate Specific Ag, Serum: 1.2 ng/mL (ref 0.0–4.0)

## 2022-05-24 ENCOUNTER — Ambulatory Visit: Payer: BC Managed Care – PPO | Admitting: Nurse Practitioner

## 2022-05-25 LAB — TOXASSURE SELECT 13 (MW), URINE

## 2022-06-08 ENCOUNTER — Ambulatory Visit (AMBULATORY_SURGERY_CENTER): Payer: BC Managed Care – PPO

## 2022-06-08 ENCOUNTER — Encounter: Payer: Self-pay | Admitting: Internal Medicine

## 2022-06-08 VITALS — Ht 75.0 in | Wt 260.0 lb

## 2022-06-08 DIAGNOSIS — Z86010 Personal history of colonic polyps: Secondary | ICD-10-CM

## 2022-06-08 MED ORDER — NA SULFATE-K SULFATE-MG SULF 17.5-3.13-1.6 GM/177ML PO SOLN: 1.0000 | Freq: Once | ORAL | 0 refills | Status: AC

## 2022-06-08 NOTE — Progress Notes (Signed)
No egg or soy allergy known to patient  No issues known to pt with past sedation with any surgeries or procedures Patient denies ever being told they had issues or difficulty with intubation  No FH of Malignant Hyperthermia Pt is not on diet pills Pt is not on  home 02  Pt is not on blood thinners  Pt denies issues with constipation  No A fib or A flutter Have any cardiac testing pending--no Pt instructed to use Singlecare.com or GoodRx for a price reduction on prep  can ambulate without assistance Patient's chart reviewed by Cathlyn Parsons CNRA prior to previsit and patient appropriate for the [facility].  Previsit completed and red dot placed by patient's name on their procedure day (on provider's schedule).

## 2022-06-20 ENCOUNTER — Encounter: Payer: Self-pay | Admitting: Certified Registered Nurse Anesthetist

## 2022-06-25 ENCOUNTER — Ambulatory Visit (AMBULATORY_SURGERY_CENTER): Payer: BC Managed Care – PPO | Admitting: Internal Medicine

## 2022-06-25 ENCOUNTER — Encounter: Payer: Self-pay | Admitting: Internal Medicine

## 2022-06-25 VITALS — BP 132/72 | HR 57 | Temp 98.3°F | Resp 14 | Ht 75.0 in | Wt 260.0 lb

## 2022-06-25 DIAGNOSIS — K635 Polyp of colon: Secondary | ICD-10-CM | POA: Diagnosis not present

## 2022-06-25 DIAGNOSIS — Z09 Encounter for follow-up examination after completed treatment for conditions other than malignant neoplasm: Secondary | ICD-10-CM | POA: Diagnosis not present

## 2022-06-25 DIAGNOSIS — D124 Benign neoplasm of descending colon: Secondary | ICD-10-CM

## 2022-06-25 DIAGNOSIS — Z860100 Personal history of colon polyps, unspecified: Secondary | ICD-10-CM

## 2022-06-25 DIAGNOSIS — D123 Benign neoplasm of transverse colon: Secondary | ICD-10-CM

## 2022-06-25 DIAGNOSIS — Z860101 Personal history of adenomatous and serrated colon polyps: Secondary | ICD-10-CM

## 2022-06-25 MED ORDER — SODIUM CHLORIDE 0.9 % IV SOLN
500.0000 mL | Freq: Once | INTRAVENOUS | Status: DC
Start: 2022-06-25 — End: 2023-01-04

## 2022-06-25 NOTE — Patient Instructions (Addendum)
I removed 5 very tiny polyps - 2 were recovered the others were not. They were tiny and am certain benign (largest polyp 3 mm).  I saw diverticulosis and small hemorrhoids also.   I will let you know pathology results and when to have another routine colonoscopy by mail and/or My Chart.  I appreciate the opportunity to care for you. Iva Boop, MD, FACG  YOU HAD AN ENDOSCOPIC PROCEDURE TODAY AT THE [facility]:   Refer to the procedure report that was given to you for any specific questions about what was found during the examination.  If the procedure report does not answer your questions, please call your gastroenterologist to clarify.  If you requested that your care partner not be given the details of your procedure findings, then the procedure report has been included in a sealed envelope for you to review at your convenience later.  YOU SHOULD EXPECT: Some feelings of bloating in the abdomen. Passage of more gas than usual.  Walking can help get rid of the air that was put into your GI tract during the procedure and reduce the bloating. If you had a lower endoscopy (such as a colonoscopy or flexible sigmoidoscopy) you may notice spotting of blood in your stool or on the toilet paper. If you underwent a bowel prep for your procedure, you may not have a normal bowel movement for a few days.  Please Note:  You might notice some irritation and congestion in your nose or some drainage.  This is from the oxygen used during your procedure.  There is no need for concern and it should clear up in a day or so.  SYMPTOMS TO REPORT IMMEDIATELY:  Following lower endoscopy (colonoscopy or flexible sigmoidoscopy):  Excessive amounts of blood in the stool  Significant tenderness or worsening of abdominal pains  Swelling of the abdomen that is new, acute  Fever of 100F or higher   For urgent or emergent issues, a gastroenterologist can be reached at any hour by calling [phone]  [phone]. Do not use MyChart messaging for urgent concerns.    DIET:  We do recommend a small meal at first, but then you may proceed to your regular diet.  Drink plenty of fluids but you should avoid alcoholic beverages for 24 hours.  MEDICATIONS: Continue present medications.  Please see handouts given to you by your recovery nurse: Polyps, Diverticulosis, Hemorrhoids.  FOLLOW UP: Repeat colonoscopy is recommended for surveillance. The colonoscopy date will be determined after pathology results from today's exam become available for review.  Thank you for allowing [place] to provide for your healthcare needs today.  ACTIVITY:  You should plan to take it easy for the rest of today and you should NOT DRIVE or use heavy machinery until tomorrow (because of the sedation medicines used during the test).    FOLLOW UP: Our staff will call the number listed on your records the next business day following your procedure.  We will call around 7:15- 8:00 am to check on you and address any questions or concerns that you may have regarding the information given to you following your procedure. If we do not reach you, we will leave a message.     If any biopsies were taken you will be contacted by phone or by letter within the next 1-3 weeks.  Please call us at [phone] if you have not heard about the biopsies in 3 weeks.    SIGNATURES/CONFIDENTIALITY: You and/or your care  partner have signed paperwork which will be entered into your electronic medical record.  These signatures attest to the fact that that the information above on your After Visit Summary has been reviewed and is understood.  Full responsibility of the confidentiality of this discharge information lies with you and/or your care-partner.

## 2022-06-25 NOTE — Progress Notes (Signed)
Called to room to assist during endoscopic procedure.  Patient ID and intended procedure confirmed with present staff. Received instructions for my participation in the procedure from the performing physician.  

## 2022-06-25 NOTE — Progress Notes (Signed)
[facility] History and Physical   Primary Care Physician:  Bennie Pierini, FNP   Reason for Procedure:   Hx colon polyps  Plan:    colonoscopy     HPI: Manuel Galloway is a 57 y.o. male for surveillance exam  03/2014 - 3 mm adenoma  04/2019 6 diminutive adenomas  Father had CRCA age 44   Past Medical History:  Diagnosis Date   Anxiety    Arthritis    Depression    Hx of adenomatous polyp of colon 04/10/2014    Past Surgical History:  Procedure Laterality Date   COLONOSCOPY     KNEE ARTHROSCOPY  1986 and 2007   left   KNEE ARTHROSCOPY  1999   right   MOUTH SURGERY     wisdom tooth removed    NASAL SEPTUM SURGERY  2005   REPLACEMENT TOTAL KNEE Left    TOTAL KNEE ARTHROPLASTY  07/14/2011   Procedure: TOTAL KNEE ARTHROPLASTY;  Surgeon: Loanne Drilling, MD;  Location: [facility];  Service: Orthopedics;  Laterality: Left;   TOTAL KNEE ARTHROPLASTY Right 07/16/2013   Procedure: RIGHT TOTAL KNEE ARTHROPLASTY;  Surgeon: Loanne Drilling, MD;  Location: [facility];  Service: Orthopedics;  Laterality: Right;   UPPER GASTROINTESTINAL ENDOSCOPY     UPPER GI ENDOSCOPY      Prior to Admission medications   Medication Sig Start Date End Date Taking? Authorizing Provider  acetaminophen (TYLENOL) 500 MG tablet Take 1,000 mg by mouth every 6 (six) hours as needed.   Yes [provider]  buPROPion (WELLBUTRIN XL) 300 MG 24 hr tablet TAKE 1 TABLET BY MOUTH EVERY DAY IN THE MORNING 05/20/22  Yes Daphine Deutscher, Mary-Margaret, FNP  gabapentin (NEURONTIN) 100 MG capsule Take 3 capsules (300 mg total) by mouth 2 (two) times daily. 05/20/22  Yes Martin, Mary-Margaret, FNP  ibuprofen (ADVIL,MOTRIN) 200 MG tablet Take 200 mg by mouth every 6 (six) hours as needed.   Yes [provider]  Omega-3 Fatty Acids (FISH OIL) 1200 MG CAPS Take by mouth.   Yes [provider]  tamsulosin (FLOMAX) 0.4 MG CAPS capsule Take 1 capsule (0.4 mg total) by mouth daily. 05/20/22  Yes Daphine Deutscher,  Mary-Margaret, FNP  ALPRAZolam Prudy Feeler) 0.5 MG tablet Take 0.5-1 tablets (0.25-0.5 mg total) by mouth 2 (two) times daily as needed for anxiety. 05/20/22   Daphine Deutscher, Mary-Margaret, FNP  fluticasone (FLONASE) 50 MCG/ACT nasal spray Place 2 sprays into both nostrils daily. Patient not taking: Reported on 05/20/2022 03/28/20   Bennie Pierini, FNP  tadalafil (CIALIS) 5 MG tablet Take 5 mg by mouth daily as needed for erectile dysfunction.    [provider]  valACYclovir (VALTREX) 1000 MG tablet Take 1 tablet (1,000 mg total) by mouth 3 (three) times daily. Patient not taking: Reported on 06/08/2022 01/26/22   Bennie Pierini, FNP    Current Outpatient Medications  Medication Sig Dispense Refill   acetaminophen (TYLENOL) 500 MG tablet Take 1,000 mg by mouth every 6 (six) hours as needed.     buPROPion (WELLBUTRIN XL) 300 MG 24 hr tablet TAKE 1 TABLET BY MOUTH EVERY DAY IN THE MORNING 90 tablet 1   gabapentin (NEURONTIN) 100 MG capsule Take 3 capsules (300 mg total) by mouth 2 (two) times daily. 180 capsule 1   ibuprofen (ADVIL,MOTRIN) 200 MG tablet Take 200 mg by mouth every 6 (six) hours as needed.     Omega-3 Fatty Acids (FISH OIL) 1200 MG CAPS Take by mouth.  tamsulosin (FLOMAX) 0.4 MG CAPS capsule Take 1 capsule (0.4 mg total) by mouth daily. 90 capsule 1   ALPRAZolam (XANAX) 0.5 MG tablet Take 0.5-1 tablets (0.25-0.5 mg total) by mouth 2 (two) times daily as needed for anxiety. 30 tablet 2   fluticasone (FLONASE) 50 MCG/ACT nasal spray Place 2 sprays into both nostrils daily. (Patient not taking: Reported on 05/20/2022) 16 g 6   tadalafil (CIALIS) 5 MG tablet Take 5 mg by mouth daily as needed for erectile dysfunction.     valACYclovir (VALTREX) 1000 MG tablet Take 1 tablet (1,000 mg total) by mouth 3 (three) times daily. (Patient not taking: Reported on 06/08/2022) 21 tablet 0   Current Facility-Administered Medications  Medication Dose Route Frequency Provider Last Rate Last  Admin   0.9 %  sodium chloride infusion  500 mL Intravenous Once Iva Boop, MD        Allergies as of 06/25/2022 - Review Complete 06/25/2022  Allergen Reaction Noted   Fish allergy Shortness Of Breath and Swelling 07/02/2011   Iodine Other (See Comments) 04/16/2019    Family History  Problem Relation Age of Onset   Cancer Father    Colon cancer Father 84   Rectal cancer Paternal Grandmother    Colon polyps Neg Hx    Esophageal cancer Neg Hx    Stomach cancer Neg Hx    Crohn's disease Neg Hx     Social History   Socioeconomic History   Marital status: Married    Spouse name: Not on file   Number of children: Not on file   Years of education: Not on file   Highest education level: Associate degree: occupational, Scientist, product/process development, or vocational program  Occupational History    Employer: [employer]  Tobacco Use   Smoking status: Former   Smokeless tobacco: Former    Quit date: 07/30/2000  Vaping Use   Vaping Use: Some days  Substance and Sexual Activity   Alcohol use: Yes    Alcohol/week: 6.0 standard drinks of alcohol    Types: 6 Cans of beer per week   Drug use: No   Sexual activity: Not on file  Other Topics Concern   Not on file  Social History Narrative   Not on file   Social Determinants of Health   Financial Resource Strain: Low Risk  (05/19/2022)   Overall Financial Resource Strain (CARDIA)    Difficulty of Paying Living Expenses: Not very hard  Food Insecurity: No Food Insecurity (05/19/2022)   Hunger Vital Sign    Worried About Running Out of Food in the Last Year: Never true    Ran Out of Food in the Last Year: Never true  Transportation Needs: No Transportation Needs (05/19/2022)   PRAPARE - Administrator, Civil Service (Medical): No    Lack of Transportation (Non-Medical): No  Physical Activity: Insufficiently Active (05/19/2022)   Exercise Vital Sign    Days of Exercise per Week: 2 days    Minutes of Exercise per Session: 20 min  Stress: Stress  Concern Present (05/19/2022)   [employer] of Occupational Health - Occupational Stress Questionnaire    Feeling of Stress : Rather much  Social Connections: Socially Isolated (05/19/2022)   Social Connection and Isolation Panel [NHANES]    Frequency of Communication with Friends and Family: Twice a week    Frequency of Social Gatherings with Friends and Family: Once a week    Attends Religious Services: Never    Active Member  of Clubs or Organizations: No    Attends Banker Meetings: Not on file    Marital Status: Separated  Intimate Partner Violence: Not on file    Review of Systems:  All other review of systems negative except as mentioned in the HPI.  Physical Exam: Vital signs BP 139/84   Pulse 90   Temp 98.3 F (36.8 C)   Ht 6\' 3"  (1.905 m)   Wt 260 lb (117.9 kg)   SpO2 95%   BMI 32.50 kg/m   General:   Alert,  Well-developed, well-nourished, pleasant and cooperative in NAD Lungs:  Clear throughout to auscultation.   Heart:  Regular rate and rhythm; no murmurs, clicks, rubs,  or gallops. Abdomen:  Soft, nontender and nondistended. Normal bowel sounds.   Neuro/Psych:  Alert and cooperative. Normal mood and affect. A and O x 3   @Lorren Splawn  Sena Slate, MD, [facility] [phone] (pager) 06/25/2022 10:59 AM@

## 2022-06-25 NOTE — Progress Notes (Signed)
Report given to PACU, vss 

## 2022-06-25 NOTE — Progress Notes (Signed)
VS by CW  Pt's states no medical or surgical changes since previsit or office visit.  

## 2022-06-25 NOTE — Op Note (Signed)
[facility] Patient Name: Manuel Galloway Procedure Date: 06/25/2022 10:58 AM MRN: [id number] Endoscopist: Iva Boop , MD, [id number] Age: 57 Referring MD:  Date of Birth: [birth year] Gender: Male Account #: [id number] Procedure:                Colonoscopy Indications:              Surveillance: Personal history of adenomatous                            polyps on last colonoscopy 3 years ago, Last                            colonoscopy: April 2021 Medicines:                Monitored Anesthesia Care Procedure:                Pre-Anesthesia Assessment:                           - Prior to the procedure, a History and Physical                            was performed, and patient medications and                            allergies were reviewed. The patient's tolerance of                            previous anesthesia was also reviewed. The risks                            and benefits of the procedure and the sedation                            options and risks were discussed with the patient.                            All questions were answered, and informed consent                            was obtained. Prior Anticoagulants: The patient has                            taken no anticoagulant or antiplatelet agents. ASA                            Grade Assessment: I - A normal, healthy patient.                            After reviewing the risks and benefits, the patient                            was deemed in satisfactory condition to undergo the  procedure.                           After obtaining informed consent, the colonoscope                            was passed under direct vision. Throughout the                            procedure, the patient's blood pressure, pulse, and                            oxygen saturations were monitored continuously. The                            CF [id number] #[id number] was introduced through the anus                             and advanced to the the cecum, identified by                            appendiceal orifice and ileocecal valve. The                            colonoscopy was performed without difficulty. The                            patient tolerated the procedure well. The quality                            of the bowel preparation was adequate. The                            ileocecal valve, appendiceal orifice, and rectum                            were photographed. The bowel preparation used was                            SUPREP via split dose instruction. Scope In: 11:14:42 AM Scope Out: 11:34:11 AM Scope Withdrawal Time: 0 hours 15 minutes 56 seconds  Total Procedure Duration: 0 hours 19 minutes 29 seconds  Findings:                 The perianal and digital rectal examinations were                            normal. Pertinent negatives include normal prostate                            (size, shape, and consistency).                           Four sessile polyps were found in the descending  colon and transverse colon. The polyps were 1 to 3                            mm in size. These polyps were removed with a cold                            snare. Resection was complete, but the polyp tissue                            was only partially retrieved. Verification of                            patient identification for the specimen was done.                            Estimated blood loss was minimal.                           Multiple diverticula were found in the sigmoid                            colon.                           Internal hemorrhoids were found.                           The exam was otherwise without abnormality on                            direct and retroflexion views. Complications:            No immediate complications. Estimated Blood Loss:     Estimated blood loss was minimal. Impression:               - Four 1 to 3 mm  polyps in the descending colon and                            in the transverse colon, removed with a cold snare.                            Complete resection. Partial retrieval.                           - Diverticulosis in the sigmoid colon.                           - Internal hemorrhoids.                           - The examination was otherwise normal on direct                            and retroflexion views.                           -  Personal history of colonic polyps. 03/2014 - 3 mm                            adenoma                           04/2019 6 diminutive adenomas                           Father had CRCA age 11 Recommendation:           - Patient has a contact number available for                            emergencies. The signs and symptoms of potential                            delayed complications were discussed with the                            patient. Return to normal activities tomorrow.                            Written discharge instructions were provided to the                            patient.                           - Resume previous diet.                           - Continue present medications.                           - Repeat colonoscopy is recommended for                            surveillance. The colonoscopy date will be                            determined after pathology results from today's                            exam become available for review. Iva Boop, MD 06/25/2022 11:43:50 AM This report has been signed electronically.

## 2022-06-28 ENCOUNTER — Telehealth: Payer: Self-pay | Admitting: *Deleted

## 2022-06-28 NOTE — Telephone Encounter (Signed)
  Follow up Call-     06/25/2022   10:25 AM  Call back number  Post procedure Call Back phone  # [phone]  Permission to leave phone message Yes     Patient questions:  Do you have a fever, pain , or abdominal swelling? No. Pain Score  0 *  Have you tolerated food without any problems? Yes.    Have you been able to return to your normal activities? Yes.    Do you have any questions about your discharge instructions: Diet   No. Medications  No. Follow up visit  No.  Do you have questions or concerns about your Care? No.  Actions: * If pain score is 4 or above: No action needed, pain <4.

## 2022-07-05 ENCOUNTER — Encounter: Payer: Self-pay | Admitting: Internal Medicine

## 2022-07-05 DIAGNOSIS — Z86010 Personal history of colonic polyps: Secondary | ICD-10-CM

## 2022-11-19 ENCOUNTER — Ambulatory Visit: Payer: BC Managed Care – PPO | Admitting: Nurse Practitioner

## 2022-12-02 ENCOUNTER — Other Ambulatory Visit: Payer: Self-pay | Admitting: Nurse Practitioner

## 2022-12-02 DIAGNOSIS — R350 Frequency of micturition: Secondary | ICD-10-CM

## 2022-12-29 ENCOUNTER — Other Ambulatory Visit: Payer: Self-pay | Admitting: Nurse Practitioner

## 2022-12-29 DIAGNOSIS — N401 Enlarged prostate with lower urinary tract symptoms: Secondary | ICD-10-CM

## 2022-12-29 MED ORDER — TAMSULOSIN HCL 0.4 MG PO CAPS: 0.4000 mg | ORAL_CAPSULE | Freq: Every day | ORAL | 0 refills | Status: DC

## 2022-12-29 NOTE — Addendum Note (Signed)
Addended by: Julious Payer D on: 12/29/2022 01:09 PM   Modules accepted: Orders

## 2022-12-29 NOTE — Telephone Encounter (Signed)
MMM pt NTBS 30-d given 12/02/22

## 2022-12-29 NOTE — Telephone Encounter (Signed)
PT SCHEDULED APPT FOR 12/17

## 2023-01-04 ENCOUNTER — Ambulatory Visit (INDEPENDENT_AMBULATORY_CARE_PROVIDER_SITE_OTHER): Payer: BC Managed Care – PPO | Admitting: Nurse Practitioner

## 2023-01-04 VITALS — BP 125/71 | HR 90 | Temp 98.4°F | Resp 20 | Ht 75.0 in | Wt 265.0 lb

## 2023-01-04 DIAGNOSIS — F419 Anxiety disorder, unspecified: Secondary | ICD-10-CM | POA: Diagnosis not present

## 2023-01-04 DIAGNOSIS — F32A Depression, unspecified: Secondary | ICD-10-CM

## 2023-01-04 DIAGNOSIS — N401 Enlarged prostate with lower urinary tract symptoms: Secondary | ICD-10-CM

## 2023-01-04 DIAGNOSIS — N400 Enlarged prostate without lower urinary tract symptoms: Secondary | ICD-10-CM

## 2023-01-04 DIAGNOSIS — Z6834 Body mass index (BMI) 34.0-34.9, adult: Secondary | ICD-10-CM | POA: Diagnosis not present

## 2023-01-04 DIAGNOSIS — G479 Sleep disorder, unspecified: Secondary | ICD-10-CM

## 2023-01-04 DIAGNOSIS — M51369 Other intervertebral disc degeneration, lumbar region without mention of lumbar back pain or lower extremity pain: Secondary | ICD-10-CM

## 2023-01-04 DIAGNOSIS — R350 Frequency of micturition: Secondary | ICD-10-CM

## 2023-01-04 LAB — LIPID PANEL

## 2023-01-04 MED ORDER — BUPROPION HCL ER (XL) 300 MG PO TB24: ORAL_TABLET | ORAL | 1 refills | Status: AC

## 2023-01-04 MED ORDER — GABAPENTIN 100 MG PO CAPS: 300.0000 mg | ORAL_CAPSULE | Freq: Two times a day (BID) | ORAL | 1 refills | Status: DC

## 2023-01-04 MED ORDER — ALPRAZOLAM 0.5 MG PO TABS: 0.2500 mg | ORAL_TABLET | Freq: Two times a day (BID) | ORAL | 2 refills | Status: DC | PRN

## 2023-01-04 MED ORDER — TAMSULOSIN HCL 0.4 MG PO CAPS: 0.4000 mg | ORAL_CAPSULE | Freq: Every day | ORAL | 1 refills | Status: DC

## 2023-01-04 NOTE — Progress Notes (Signed)
Subjective:    Patient ID: Manuel Galloway, male    DOB: 1965-02-27, 57 y.o.   MRN: [id number]   Chief Complaint: medical management of chronic issues     HPI:  Manuel Galloway is a 57 y.o. who identifies as a male who was assigned male at birth.   Social history: Lives with: girlfriend Work history: [employer]   Comes in today for follow up of the following chronic medical issues:  1. Difficulty sleeping Sleeping well lately  2. Anxiety and depression Is on combination of wellbutrin and xanax. He says he is doing well .    01/04/2023    8:04 AM 05/20/2022    9:06 AM 01/26/2022   10:03 AM 12/26/2020    4:15 PM  GAD 7 : Generalized Anxiety Score  Nervous, Anxious, on Edge 0 1 2 0  Control/stop worrying 1 1 3  0  Worry too much - different things 1 1 3  0  Trouble relaxing 0 1 3 0  Restless 0 0 2 0  Easily annoyed or irritable 1 1 3  0  Afraid - awful might happen 0 1 2 0  Total GAD 7 Score 3 6 18  0  Anxiety Difficulty Not difficult at all Somewhat difficult Somewhat difficult Not difficult at all       01/04/2023    8:04 AM 05/20/2022    9:05 AM 01/26/2022   10:02 AM  Depression screen PHQ 2/9  Decreased Interest 0 1 2  Down, Depressed, Hopeless 0 1 2  PHQ - 2 Score 0 2 4  Altered sleeping 1 0 0  Tired, decreased energy 1 1 1   Change in appetite 1 1 0  Feeling bad or failure about yourself  1 1 3   Trouble concentrating 1 1 1   Moving slowly or fidgety/restless 0 0 0  Suicidal thoughts 1 1 2   PHQ-9 Score 6 7 11   Difficult doing work/chores Not difficult at all Somewhat difficult Somewhat difficult     3. Benign prostatic hyperplasia without lower urinary tract symptoms Is on flomax daily and denies any voiding issues. Lab Results  Component Value Date   PSA1 1.2 05/20/2022   PSA1 1.0 02/21/2019   PSA 0.8 11/02/2012      4. BMI 34.0-34.9,adult Weight is up 5lbs Wt Readings from Last 3 Encounters:  01/04/23 265 lb (120.2 kg)  06/25/22 260 lb (117.9 kg)   06/08/22 260 lb (117.9 kg)   BMI Readings from Last 3 Encounters:  01/04/23 33.12 kg/m  06/25/22 32.50 kg/m  06/08/22 32.50 kg/m      New complaints: none  Allergies  Allergen Reactions   Fish Allergy Shortness Of Breath and Swelling   Iodine Other (See Comments)    unkown   Outpatient Encounter Medications as of 01/04/2023  Medication Sig   acetaminophen (TYLENOL) 500 MG tablet Take 1,000 mg by mouth every 6 (six) hours as needed.   ALPRAZolam (XANAX) 0.5 MG tablet Take 0.5-1 tablets (0.25-0.5 mg total) by mouth 2 (two) times daily as needed for anxiety.   buPROPion (WELLBUTRIN XL) 300 MG 24 hr tablet TAKE 1 TABLET BY MOUTH EVERY DAY IN THE MORNING   fluticasone (FLONASE) 50 MCG/ACT nasal spray Place 2 sprays into both nostrils daily. (Patient not taking: Reported on 05/20/2022)   gabapentin (NEURONTIN) 100 MG capsule Take 3 capsules (300 mg total) by mouth 2 (two) times daily.   ibuprofen (ADVIL,MOTRIN) 200 MG tablet Take 200 mg by mouth every 6 (six)  hours as needed.   Omega-3 Fatty Acids (FISH OIL) 1200 MG CAPS Take by mouth.   tadalafil (CIALIS) 5 MG tablet Take 5 mg by mouth daily as needed for erectile dysfunction.   tamsulosin (FLOMAX) 0.4 MG CAPS capsule Take 1 capsule (0.4 mg total) by mouth daily.   valACYclovir (VALTREX) 1000 MG tablet Take 1 tablet (1,000 mg total) by mouth 3 (three) times daily. (Patient not taking: Reported on 06/08/2022)   [DISCONTINUED] 0.9 %  sodium chloride infusion    No facility-administered encounter medications on file as of 01/04/2023.    Past Surgical History:  Procedure Laterality Date   COLONOSCOPY     KNEE ARTHROSCOPY  1986 and 2007   left   KNEE ARTHROSCOPY  1999   right   MOUTH SURGERY     wisdom tooth removed    NASAL SEPTUM SURGERY  2005   REPLACEMENT TOTAL KNEE Left    TOTAL KNEE ARTHROPLASTY  07/14/2011   Procedure: TOTAL KNEE ARTHROPLASTY;  Surgeon: Loanne Drilling, MD;  Location: [facility];  Service: Orthopedics;   Laterality: Left;   TOTAL KNEE ARTHROPLASTY Right 07/16/2013   Procedure: RIGHT TOTAL KNEE ARTHROPLASTY;  Surgeon: Loanne Drilling, MD;  Location: [facility];  Service: Orthopedics;  Laterality: Right;   UPPER GASTROINTESTINAL ENDOSCOPY     UPPER GI ENDOSCOPY      Family History  Problem Relation Age of Onset   Cancer Father    Colon cancer Father 73   Rectal cancer Paternal Grandmother    Colon polyps Neg Hx    Esophageal cancer Neg Hx    Stomach cancer Neg Hx    Crohn's disease Neg Hx       Controlled substance contract: n/a     Review of Systems  Constitutional:  Negative for diaphoresis.  Eyes:  Negative for pain.  Respiratory:  Negative for shortness of breath.   Cardiovascular:  Negative for chest pain, palpitations and leg swelling.  Gastrointestinal:  Negative for abdominal pain.  Endocrine: Negative for polydipsia.  Skin:  Negative for rash.  Neurological:  Negative for dizziness, weakness and headaches.  Hematological:  Does not bruise/bleed easily.  All other systems reviewed and are negative.      Objective:   Physical Exam Vitals and nursing note reviewed.  Constitutional:      Appearance: Normal appearance. He is well-developed.  HENT:     Head: Normocephalic.     Nose: Nose normal.     Mouth/Throat:     Mouth: Mucous membranes are moist.     Pharynx: Oropharynx is clear.  Eyes:     Pupils: Pupils are equal, round, and reactive to light.  Neck:     Thyroid: No thyroid mass or thyromegaly.     Vascular: No carotid bruit or JVD.     Trachea: Phonation normal.  Cardiovascular:     Rate and Rhythm: Normal rate and regular rhythm.  Pulmonary:     Effort: Pulmonary effort is normal. No respiratory distress.     Breath sounds: Normal breath sounds.  Abdominal:     General: Bowel sounds are normal.     Palpations: Abdomen is soft.     Tenderness: There is no abdominal tenderness.  Musculoskeletal:        General: Normal range of motion.     Cervical  back: Normal range of motion and neck supple.  Lymphadenopathy:     Cervical: No cervical adenopathy.  Skin:    General: Skin is  warm and dry.  Neurological:     Mental Status: He is alert and oriented to person, place, and time.  Psychiatric:        Behavior: Behavior normal.        Thought Content: Thought content normal.        Judgment: Judgment normal.     BP 125/71   Pulse 90   Temp 98.4 F (36.9 C) (Temporal)   Resp 20   Ht 6\' 3"  (1.905 m)   Wt 265 lb (120.2 kg)   SpO2 97%   BMI 33.12 kg/m        Assessment & Plan:   Manuel Galloway comes in today with chief complaint of Medical Management of Chronic Issues   Diagnosis and orders addressed:  1. Difficulty sleeping (Primary) Bedtime routine  2. Anxiety and depression Stress management - buPROPion (WELLBUTRIN XL) 300 MG 24 hr tablet; TAKE 1 TABLET BY MOUTH EVERY DAY IN THE MORNING  Dispense: 90 tablet; Refill: 1 - ALPRAZolam (XANAX) 0.5 MG tablet; Take 0.5-1 tablets (0.25-0.5 mg total) by mouth 2 (two) times daily as needed for anxiety.  Dispense: 30 tablet; Refill: 2 - CBC with Differential/Platelet - CMP14+EGFR - Lipid panel  3. Benign prostatic hyperplasia without lower urinary tract symptoms Report any voiding issues  4. BMI 34.0-34.9,adult Discussed diet and exercise for person with BMI >25 Will recheck weight in 3-6 months   5. Benign prostatic hyperplasia with urinary frequency - tamsulosin (FLOMAX) 0.4 MG CAPS capsule; Take 1 capsule (0.4 mg total) by mouth daily.  Dispense: 90 capsule; Refill: 1  6. DDD (degenerative disc disease), lumbar - gabapentin (NEURONTIN) 100 MG capsule; Take 3 capsules (300 mg total) by mouth 2 (two) times daily.  Dispense: 180 capsule; Refill: 1   Labs pending Health Maintenance reviewed Diet and exercise encouraged  Follow up plan: 6 months   Mary-Margaret Daphine Deutscher, FNP

## 2023-01-05 LAB — CMP14+EGFR
ALT: 30 IU/L (ref 0–44)
AST: 23 IU/L (ref 0–40)
Albumin: 4.8 g/dL (ref 3.8–4.9)
Alkaline Phosphatase: 65 IU/L (ref 44–121)
BUN/Creatinine Ratio: 19 (ref 9–20)
BUN: 18 mg/dL (ref 6–24)
Bilirubin Total: 0.2 mg/dL (ref 0.0–1.2)
CO2: 20 mmol/L (ref 20–29)
Calcium: 9.7 mg/dL (ref 8.7–10.2)
Chloride: 103 mmol/L (ref 96–106)
Creatinine, Ser: 0.93 mg/dL (ref 0.76–1.27)
Globulin, Total: 2.4 g/dL (ref 1.5–4.5)
Glucose: 103 mg/dL — ABNORMAL HIGH (ref 70–99)
Potassium: 4.4 mmol/L (ref 3.5–5.2)
Sodium: 139 mmol/L (ref 134–144)
Total Protein: 7.2 g/dL (ref 6.0–8.5)
eGFR: 95 mL/min/{1.73_m2} (ref >59)

## 2023-01-05 LAB — LIPID PANEL
Cholesterol, Total: 252 mg/dL — ABNORMAL HIGH (ref 100–199)
HDL: 47 mg/dL (ref >39)
LDL CALC COMMENT:: 5.4 ratio — ABNORMAL HIGH (ref 0.0–5.0)
LDL Chol Calc (NIH): 158 mg/dL — ABNORMAL HIGH (ref 0–99)
Triglycerides: 256 mg/dL — ABNORMAL HIGH (ref 0–149)
VLDL Cholesterol Cal: 47 mg/dL — ABNORMAL HIGH (ref 5–40)

## 2023-01-05 LAB — CBC WITH DIFFERENTIAL/PLATELET
Basophils Absolute: 0.1 10*3/uL (ref 0.0–0.2)
Basos: 1 %
EOS (ABSOLUTE): 0.3 10*3/uL (ref 0.0–0.4)
Eos: 4 %
Hematocrit: 46.1 % (ref 37.5–51.0)
Hemoglobin: 15.3 g/dL (ref 13.0–17.7)
Immature Grans (Abs): 0.1 10*3/uL (ref 0.0–0.1)
Immature Granulocytes: 1 %
Lymphocytes Absolute: 2.5 10*3/uL (ref 0.7–3.1)
Lymphs: 31 %
MCH: 30.6 pg (ref 26.6–33.0)
MCHC: 33.2 g/dL (ref 31.5–35.7)
MCV: 92 fL (ref 79–97)
Monocytes Absolute: 0.5 10*3/uL (ref 0.1–0.9)
Monocytes: 7 %
Neutrophils Absolute: 4.5 10*3/uL (ref 1.4–7.0)
Neutrophils: 56 %
Platelets: 221 10*3/uL (ref 150–450)
RBC: 5 x10E6/uL (ref 4.14–5.80)
RDW: 12.2 % (ref 11.6–15.4)
WBC: 8 10*3/uL (ref 3.4–10.8)

## 2023-07-05 ENCOUNTER — Ambulatory Visit: Payer: BC Managed Care – PPO | Admitting: Nurse Practitioner

## 2023-07-05 ENCOUNTER — Encounter: Payer: Self-pay | Admitting: Nurse Practitioner

## 2023-07-05 VITALS — BP 115/70 | HR 70 | Temp 97.8°F | Ht 75.0 in | Wt 230.0 lb

## 2023-07-05 DIAGNOSIS — Z0000 Encounter for general adult medical examination without abnormal findings: Secondary | ICD-10-CM

## 2023-07-05 DIAGNOSIS — L989 Disorder of the skin and subcutaneous tissue, unspecified: Secondary | ICD-10-CM

## 2023-07-05 DIAGNOSIS — N401 Enlarged prostate with lower urinary tract symptoms: Secondary | ICD-10-CM

## 2023-07-05 DIAGNOSIS — G479 Sleep disorder, unspecified: Secondary | ICD-10-CM | POA: Diagnosis not present

## 2023-07-05 DIAGNOSIS — M51369 Other intervertebral disc degeneration, lumbar region without mention of lumbar back pain or lower extremity pain: Secondary | ICD-10-CM

## 2023-07-05 DIAGNOSIS — F32A Depression, unspecified: Secondary | ICD-10-CM

## 2023-07-05 DIAGNOSIS — M5136 Other intervertebral disc degeneration, lumbar region with discogenic back pain only: Secondary | ICD-10-CM

## 2023-07-05 DIAGNOSIS — R350 Frequency of micturition: Secondary | ICD-10-CM

## 2023-07-05 DIAGNOSIS — Z6834 Body mass index (BMI) 34.0-34.9, adult: Secondary | ICD-10-CM

## 2023-07-05 DIAGNOSIS — Z0001 Encounter for general adult medical examination with abnormal findings: Secondary | ICD-10-CM | POA: Diagnosis not present

## 2023-07-05 DIAGNOSIS — F419 Anxiety disorder, unspecified: Secondary | ICD-10-CM

## 2023-07-05 DIAGNOSIS — N400 Enlarged prostate without lower urinary tract symptoms: Secondary | ICD-10-CM

## 2023-07-05 LAB — LIPID PANEL
Chol/HDL Ratio: 5 ratio (ref 0.0–5.0)
Cholesterol, Total: 199 mg/dL (ref 100–199)
HDL: 40 mg/dL (ref >39)
LDL Chol Calc (NIH): 142 mg/dL — ABNORMAL HIGH (ref 0–99)
Triglycerides: 91 mg/dL (ref 0–149)
VLDL Cholesterol Cal: 17 mg/dL (ref 5–40)

## 2023-07-05 LAB — CBC WITH DIFFERENTIAL/PLATELET
Basophils Absolute: 0.1 10*3/uL (ref 0.0–0.2)
Basos: 1 %
EOS (ABSOLUTE): 0.2 10*3/uL (ref 0.0–0.4)
Eos: 4 %
Hematocrit: 47.1 % (ref 37.5–51.0)
Hemoglobin: 15.3 g/dL (ref 13.0–17.7)
Immature Grans (Abs): 0 10*3/uL (ref 0.0–0.1)
Immature Granulocytes: 0 %
Lymphocytes Absolute: 2.1 10*3/uL (ref 0.7–3.1)
Lymphs: 43 %
MCH: 30.4 pg (ref 26.6–33.0)
MCHC: 32.5 g/dL (ref 31.5–35.7)
MCV: 94 fL (ref 79–97)
Monocytes Absolute: 0.3 10*3/uL (ref 0.1–0.9)
Monocytes: 6 %
Neutrophils Absolute: 2.2 10*3/uL (ref 1.4–7.0)
Neutrophils: 46 %
Platelets: 184 10*3/uL (ref 150–450)
RBC: 5.03 x10E6/uL (ref 4.14–5.80)
RDW: 12.6 % (ref 11.6–15.4)
WBC: 4.9 10*3/uL (ref 3.4–10.8)

## 2023-07-05 LAB — CMP14+EGFR
ALT: 21 IU/L (ref 0–44)
AST: 20 IU/L (ref 0–40)
Albumin: 4.8 g/dL (ref 3.8–4.9)
Alkaline Phosphatase: 72 IU/L (ref 44–121)
BUN/Creatinine Ratio: 16 (ref 10–24)
BUN: 16 mg/dL (ref 8–27)
Bilirubin Total: 0.8 mg/dL (ref 0.0–1.2)
CO2: 21 mmol/L (ref 20–29)
Calcium: 9.6 mg/dL (ref 8.6–10.2)
Chloride: 102 mmol/L (ref 96–106)
Creatinine, Ser: 1.01 mg/dL (ref 0.76–1.27)
Globulin, Total: 2.3 g/dL (ref 1.5–4.5)
Glucose: 94 mg/dL (ref 70–99)
Potassium: 4.3 mmol/L (ref 3.5–5.2)
Sodium: 141 mmol/L (ref 134–144)
Total Protein: 7.1 g/dL (ref 6.0–8.5)
eGFR: 85 mL/min/{1.73_m2} (ref >59)

## 2023-07-05 MED ORDER — TAMSULOSIN HCL 0.4 MG PO CAPS
0.4000 mg | ORAL_CAPSULE | Freq: Every day | ORAL | 1 refills | Status: AC
Start: 2023-07-05 — End: ?

## 2023-07-05 MED ORDER — GABAPENTIN 100 MG PO CAPS: 300.0000 mg | ORAL_CAPSULE | Freq: Two times a day (BID) | ORAL | 1 refills | Status: DC

## 2023-07-05 MED ORDER — ALPRAZOLAM 0.5 MG PO TABS: 0.2500 mg | ORAL_TABLET | Freq: Two times a day (BID) | ORAL | 5 refills | Status: AC | PRN

## 2023-07-05 NOTE — Patient Instructions (Signed)

## 2023-07-05 NOTE — Progress Notes (Signed)
 Subjective:    Patient ID: Manuel Galloway, male    DOB: 07-13-1965, 58 y.o.   MRN: [id number]   Chief Complaint: annual physical     HPI:  Manuel Galloway is a 58 y.o. who identifies as a male who was assigned male at birth.   Social history: Lives with: girlfriend Work history: [employer]   Comes in today for follow up of the following chronic medical issues:  1. Difficulty sleeping Sleeping well lately  2. Anxiety and depression Is on combination of wellbutrin  and xanax . He says he is doing well .     07/05/2023    8:06 AM 01/04/2023    8:04 AM 05/20/2022    9:06 AM 01/26/2022   10:03 AM  GAD 7 : Generalized Anxiety Score  Nervous, Anxious, on Edge 1 0 1 2  Control/stop worrying 1 1 1 3   Worry too much - different things 1 1 1 3   Trouble relaxing 1 0 1 3  Restless 0 0 0 2  Easily annoyed or irritable 1 1 1 3   Afraid - awful might happen 0 0 1 2  Total GAD 7 Score 5 3 6 18   Anxiety Difficulty Somewhat difficult Not difficult at all Somewhat difficult Somewhat difficult       07/05/2023    8:05 AM 01/04/2023    8:04 AM 05/20/2022    9:05 AM  Depression screen PHQ 2/9  Decreased Interest 1 0 1  Down, Depressed, Hopeless 0 0 1  PHQ - 2 Score 1 0 2  Altered sleeping  1 0  Tired, decreased energy  1 1  Change in appetite  1 1  Feeling bad or failure about yourself   1 1  Trouble concentrating  1 1  Moving slowly or fidgety/restless  0 0  Suicidal thoughts  1 1  PHQ-9 Score  6 7  Difficult doing work/chores  Not difficult at all Somewhat difficult      3. Benign prostatic hyperplasia without lower urinary tract symptoms Is on flomax  daily and denies any voiding issues. Lab Results  Component Value Date   PSA1 1.2 05/20/2022   PSA1 1.0 02/21/2019   PSA 0.8 11/02/2012      4. DDD Has a history of chronic back pain. He is on neurontin  which helps a lot with burning sensation. He denies any medication side effects.    5. BMI 34.0-34.9,adult Weight is down  35lbs  Wt Readings from Last 3 Encounters:  07/05/23 230 lb (104.3 kg)  01/04/23 265 lb (120.2 kg)  06/25/22 260 lb (117.9 kg)   BMI Readings from Last 3 Encounters:  07/05/23 28.75 kg/m  01/04/23 33.12 kg/m  06/25/22 32.50 kg/m       New complaints: none  Allergies  Allergen Reactions   Fish Allergy Shortness Of Breath and Swelling   Iodine Other (See Comments)    unkown   Outpatient Encounter Medications as of 07/05/2023  Medication Sig   acetaminophen  (TYLENOL ) 500 MG tablet Take 1,000 mg by mouth every 6 (six) hours as needed.   ALPRAZolam  (XANAX ) 0.5 MG tablet Take 0.5-1 tablets (0.25-0.5 mg total) by mouth 2 (two) times daily as needed for anxiety.   buPROPion  (WELLBUTRIN  XL) 300 MG 24 hr tablet TAKE 1 TABLET BY MOUTH EVERY DAY IN THE MORNING   fluticasone  (FLONASE ) 50 MCG/ACT nasal spray Place 2 sprays into both nostrils daily.   gabapentin  (NEURONTIN ) 100 MG capsule Take 3 capsules (300 mg  total) by mouth 2 (two) times daily.   ibuprofen (ADVIL,MOTRIN) 200 MG tablet Take 200 mg by mouth every 6 (six) hours as needed.   Omega-3 Fatty Acids (FISH OIL) 1200 MG CAPS Take by mouth.   tamsulosin  (FLOMAX ) 0.4 MG CAPS capsule Take 1 capsule (0.4 mg total) by mouth daily.   valACYclovir  (VALTREX ) 1000 MG tablet Take 1 tablet (1,000 mg total) by mouth 3 (three) times daily. (Patient not taking: Reported on 01/04/2023)   No facility-administered encounter medications on file as of 07/05/2023.    Past Surgical History:  Procedure Laterality Date   COLONOSCOPY     KNEE ARTHROSCOPY  1986 and 2007   left   KNEE ARTHROSCOPY  1999   right   MOUTH SURGERY     wisdom tooth removed    NASAL SEPTUM SURGERY  2005   REPLACEMENT TOTAL KNEE Left    TOTAL KNEE ARTHROPLASTY  07/14/2011   Procedure: TOTAL KNEE ARTHROPLASTY;  Surgeon: Aurther Blue, MD;  Location: [facility];  Service: Orthopedics;  Laterality: Left;   TOTAL KNEE ARTHROPLASTY Right 07/16/2013   Procedure: RIGHT TOTAL  KNEE ARTHROPLASTY;  Surgeon: Aurther Blue, MD;  Location: [facility];  Service: Orthopedics;  Laterality: Right;   UPPER GASTROINTESTINAL ENDOSCOPY     UPPER GI ENDOSCOPY      Family History  Problem Relation Age of Onset   Cancer Father    Colon cancer Father 84   Rectal cancer Paternal Grandmother    Colon polyps Neg Hx    Esophageal cancer Neg Hx    Stomach cancer Neg Hx    Crohn's disease Neg Hx       Controlled substance contract: 07/05/23     Review of Systems  Constitutional:  Negative for diaphoresis.  Eyes:  Negative for pain.  Respiratory:  Negative for shortness of breath.   Cardiovascular:  Negative for chest pain, palpitations and leg swelling.  Gastrointestinal:  Negative for abdominal pain.  Endocrine: Negative for polydipsia.  Skin:  Negative for rash.  Neurological:  Negative for dizziness, weakness and headaches.  Hematological:  Does not bruise/bleed easily.  All other systems reviewed and are negative.      Objective:   Physical Exam Vitals and nursing note reviewed.  Constitutional:      Appearance: Normal appearance. He is well-developed.  HENT:     Head: Normocephalic.     Nose: Nose normal.     Mouth/Throat:     Mouth: Mucous membranes are moist.     Pharynx: Oropharynx is clear.   Eyes:     Pupils: Pupils are equal, round, and reactive to light.   Neck:     Thyroid : No thyroid  mass or thyromegaly.     Vascular: No carotid bruit or JVD.     Trachea: Phonation normal.   Cardiovascular:     Rate and Rhythm: Normal rate and regular rhythm.  Pulmonary:     Effort: Pulmonary effort is normal. No respiratory distress.     Breath sounds: Normal breath sounds.  Abdominal:     General: Bowel sounds are normal.     Palpations: Abdomen is soft.     Tenderness: There is no abdominal tenderness.   Musculoskeletal:        General: Normal range of motion.     Cervical back: Normal range of motion and neck supple.  Lymphadenopathy:      Cervical: No cervical adenopathy.   Skin:    General: Skin is warm  and dry.   Neurological:     Mental Status: He is alert and oriented to person, place, and time.   Psychiatric:        Behavior: Behavior normal.        Thought Content: Thought content normal.        Judgment: Judgment normal.     BP 115/70   Pulse 70   Temp 97.8 F (36.6 C) (Temporal)   Ht 6' 3 (1.905 m)   Wt 230 lb (104.3 kg)   SpO2 98%   BMI 28.75 kg/m         Assessment & Plan:   Manuel Galloway comes in today with chief complaint of annual physical  Diagnosis and orders addressed:  1. Difficulty sleeping (Primary) Bedtime routine  2. Anxiety and depression Stress management - buPROPion  (WELLBUTRIN  XL) 300 MG 24 hr tablet; TAKE 1 TABLET BY MOUTH EVERY DAY IN THE MORNING  Dispense: 90 tablet; Refill: 1 - ALPRAZolam  (XANAX ) 0.5 MG tablet; Take 0.5-1 tablets (0.25-0.5 mg total) by mouth 2 (two) times daily as needed for anxiety.  Dispense: 30 tablet; Refill: 2 - CBC with Differential/Platelet - CMP14+EGFR - Lipid panel  3. Benign prostatic hyperplasia without lower urinary tract symptoms Report any voiding issues  4. BMI 34.0-34.9,adult Discussed diet and exercise for person with BMI >25 Will recheck weight in 3-6 months   5. Benign prostatic hyperplasia with urinary frequency - tamsulosin  (FLOMAX ) 0.4 MG CAPS capsule; Take 1 capsule (0.4 mg total) by mouth daily.  Dispense: 90 capsule; Refill: 1  6. DDD (degenerative disc disease), lumbar - gabapentin  (NEURONTIN ) 100 MG capsule; Take 3 capsules (300 mg total) by mouth 2 (two) times daily.  Dispense: 180 capsule; Refill: 1   Labs pending Health Maintenance reviewed Diet and exercise encouraged  Follow up plan: 6 months   Mary-Margaret Gaylyn Keas, FNP

## 2023-07-07 ENCOUNTER — Ambulatory Visit: Payer: Self-pay | Admitting: Nurse Practitioner

## 2023-07-09 LAB — TOXASSURE SELECT 13 (MW), URINE

## 2023-08-24 DIAGNOSIS — B009 Herpesviral infection, unspecified: Secondary | ICD-10-CM

## 2023-08-25 MED ORDER — VALACYCLOVIR HCL 1 G PO TABS
1000.0000 mg | ORAL_TABLET | Freq: Three times a day (TID) | ORAL | 0 refills | Status: AC
Start: 2023-08-25 — End: ?

## 2023-11-15 ENCOUNTER — Ambulatory Visit (INDEPENDENT_AMBULATORY_CARE_PROVIDER_SITE_OTHER): Admitting: Dermatology

## 2023-11-15 ENCOUNTER — Encounter: Payer: Self-pay | Admitting: Dermatology

## 2023-11-15 VITALS — BP 114/88 | HR 69

## 2023-11-15 DIAGNOSIS — D485 Neoplasm of uncertain behavior of skin: Secondary | ICD-10-CM

## 2023-11-15 DIAGNOSIS — C4491 Basal cell carcinoma of skin, unspecified: Secondary | ICD-10-CM

## 2023-11-15 DIAGNOSIS — L814 Other melanin hyperpigmentation: Secondary | ICD-10-CM | POA: Diagnosis not present

## 2023-11-15 DIAGNOSIS — L821 Other seborrheic keratosis: Secondary | ICD-10-CM | POA: Diagnosis not present

## 2023-11-15 DIAGNOSIS — L578 Other skin changes due to chronic exposure to nonionizing radiation: Secondary | ICD-10-CM | POA: Diagnosis not present

## 2023-11-15 DIAGNOSIS — W908XXA Exposure to other nonionizing radiation, initial encounter: Secondary | ICD-10-CM

## 2023-11-15 DIAGNOSIS — C44319 Basal cell carcinoma of skin of other parts of face: Secondary | ICD-10-CM | POA: Diagnosis not present

## 2023-11-15 HISTORY — DX: Basal cell carcinoma of skin, unspecified: C44.91

## 2023-11-15 NOTE — Progress Notes (Unsigned)
   New Patient Visit   Subjective  Manuel Galloway is a 58 y.o. male who presents for the following: spots  Pt has spots on his back and face he'd like evaluated. He has no hx of skin cancer. No family hx of MM.  The following portions of the chart were reviewed this encounter and updated as appropriate: medications, allergies, medical history  Review of Systems:  No other skin or systemic complaints except as noted in HPI or Assessment and Plan.  Objective  Well appearing patient in no apparent distress; mood and affect are within normal limits.  A focused examination was performed of the following areas: Back and face  Relevant exam findings are noted in the Assessment and Plan.    Assessment & Plan     NEOPLASM OF UNCERTAIN BEHAVIOR OF SKIN Left Malar Cheek Skin / nail biopsy Type of biopsy: tangential   Informed consent: discussed and consent obtained   Timeout: patient name, date of birth, surgical site, and procedure verified   Procedure prep:  Patient was prepped and draped in usual sterile fashion Prep type:  Isopropyl alcohol Anesthesia: the lesion was anesthetized in a standard fashion   Anesthetic:  1% lidocaine w/ epinephrine 1-100,000 buffered w/ 8.4% NaHCO3 Instrument used: DermaBlade   Hemostasis achieved with: aluminum chloride   Outcome: patient tolerated procedure well   Post-procedure details: sterile dressing applied and wound care instructions given   Dressing type: bandage and petrolatum    Specimen 1 - Surgical pathology Differential Diagnosis: r/o NMSC vs other  Check Margins: No  Return for pending pathology.  I, Darice Smock, CMA, am acting as scribe for RUFUS CHRISTELLA HOLY, MD.   Documentation: I have reviewed the above documentation for accuracy and completeness, and I agree with the above.  RUFUS CHRISTELLA HOLY, MD

## 2023-11-15 NOTE — Patient Instructions (Addendum)

## 2023-11-16 ENCOUNTER — Ambulatory Visit: Payer: Self-pay | Admitting: Dermatology

## 2023-11-16 LAB — SURGICAL PATHOLOGY

## 2023-12-28 ENCOUNTER — Encounter: Payer: Self-pay | Admitting: Dermatology

## 2023-12-28 ENCOUNTER — Ambulatory Visit (INDEPENDENT_AMBULATORY_CARE_PROVIDER_SITE_OTHER): Admitting: Dermatology

## 2023-12-28 DIAGNOSIS — L814 Other melanin hyperpigmentation: Secondary | ICD-10-CM

## 2023-12-28 DIAGNOSIS — C44319 Basal cell carcinoma of skin of other parts of face: Secondary | ICD-10-CM

## 2023-12-28 DIAGNOSIS — L570 Actinic keratosis: Secondary | ICD-10-CM | POA: Diagnosis not present

## 2023-12-28 DIAGNOSIS — C4491 Basal cell carcinoma of skin, unspecified: Secondary | ICD-10-CM

## 2023-12-28 MED ORDER — MUPIROCIN 2 % EX OINT: 1.0000 | TOPICAL_OINTMENT | Freq: Two times a day (BID) | CUTANEOUS | 2 refills | Status: AC

## 2023-12-28 NOTE — Patient Instructions (Signed)

## 2023-12-28 NOTE — Progress Notes (Signed)
 Follow-Up Visit   Subjective  Manuel Galloway is a 58 y.o. 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 Corey.   The following portions of the chart were reviewed this encounter and updated as appropriate: medications, allergies, medical history  Review of Systems:  No other skin or systemic complaints except as noted in HPI or Assessment and Plan.  Objective  Well appearing patient in no apparent distress; mood and affect are within normal limits.  A focused examination was performed of the following areas: Left malar cheek Relevant physical exam findings are noted in the Assessment and Plan.   Left Malar Cheek Pink nodule   Assessment & Plan   BASAL CELL CARCINOMA (BCC), UNSPECIFIED SITE Left Malar Cheek Mohs surgery  Consent obtained: written  Anticoagulation: Is the patient taking prescription anticoagulant and/or aspirin prescribed/recommended by a physician? No    Anesthesia: Anesthesia method: local infiltration  Procedure Details: Timeout: pre-procedure verification complete Procedure Prep: patient was prepped and draped in usual sterile fashion Biopsy accession number: [id number] Pre-Op diagnosis: basal cell carcinoma BCC subtype: nodular Surgery side: left Surgical site (from skin exam): Left Malar Cheek Pre-operative length (cm): 0.4 Pre-operative width (cm): 0.4 Indications for Mohs surgery: anatomic location where tissue conservation is critical  Micrographic Surgery Details: Post-operative length (cm): 0.8 Post-operative width (cm): 0.8 Number of Mohs stages: 1 Post surgery depth of defect: subcutaneous fat  Stage 1    Tumor features identified on Mohs section: no tumor identified  Patient tolerance of procedure: tolerated well, no immediate complications  Reconstruction: Was the defect reconstructed? Yes   Was reconstruction performed by the same Mohs surgeon? Yes    Setting of reconstruction: outpatient office Type of reconstruction: linear Linear reconstruction: complex  Opioids: Did the patient receive a prescription for opioid/narcotic related to Mohs surgery?: No    Antibiotics: Does patient meet AHA guidelines for endocarditis?: No   Does patient meet AHA guidelines for orthopedic prophylaxis?: No   Were antibiotics given on the day of surgery?: No   Did surgery breach mucosa, expose cartilage/bone, involve an area of lymphedema/inflamed/infected tissue? No    Skin repair Complexity:  Complex Final length (cm):  3.2 Informed consent: discussed and consent obtained   Timeout: patient name, date of birth, surgical site, and procedure verified   Procedure prep:  Patient was prepped and draped in usual sterile fashion Prep type:  Chlorhexidine  Anesthesia: the lesion was anesthetized in a standard fashion   Anesthetic:  1% lidocaine w/ epinephrine 1-100,000 buffered w/ 8.4% NaHCO3 (6cc) Reason for type of repair: reduce the risk of dehiscence, infection, and necrosis, allow closure of the large defect and preserve normal anatomy   Undermining: area extensively undermined   Subcutaneous layers (deep stitches):  Suture size:  5-0 Suture type: Monocryl (poliglecaprone 25)   Stitches:  Buried vertical mattress Fine/surface layer approximation (top stitches):  Suture size:  6-0 Suture type: fast-absorbing plain gut   Stitches: simple running   Hemostasis achieved with: suture, pressure and electrodesiccation Outcome: patient tolerated procedure well with no complications   Post-procedure details: sterile dressing applied and wound care instructions given   Dressing type: pressure dressing and petrolatum      Return in about 4 weeks (around 01/25/2024) for Follow Up.  I, Doyce Pan, CMA, am acting as scribe for RUFUS CHRISTELLA COREY, MD.    12/28/2023  HISTORY OF PRESENT ILLNESS  Manuel Galloway is seen  in consultation at the request of Dr.  Tillie Viverette for biopsy-proven Nodular Basal Cell Carcinoma of the left malar cheek. They note that the area has been present for about 4 years increasing in size with time.  There is no history of previous treatment.  Reports no other new or changing lesions and has no other complaints today.  Medications and allergies: see patient chart.  Review of systems: Reviewed 8 systems and notable for the above skin cancer.  All other systems reviewed are unremarkable/negative, unless noted in the HPI. Past medical history, surgical history, family history, social history were also reviewed and are noted in the chart/questionnaire.    PHYSICAL EXAMINATION  General: Well-appearing, in no acute distress, alert and oriented x 4. Vitals reviewed in chart (if available).   Skin: Exam reveals a 0.4 x 0.4 cm erythematous papule and biopsy scar on the left malar cheek. There are rhytids, telangiectasias, and lentigines, consistent with photodamage.  Biopsy report(s) reviewed, confirming the diagnosis.   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 Due to location, size, histology, or recurrence and the likelihood of subclinical extension as well as the need to conserve normal surrounding tissue, the patient was deemed acceptable for Mohs micrographic surgery (MMS).  The nature and purpose of the procedure, associated benefits and risks including recurrence and scarring, possible complications such as pain, infection, and bleeding, and alternative methods of treatment if appropriate were discussed with the patient during consent. The lesion location was verified by the patient, by reviewing previous notes, pathology reports, and by photographs as well as angulation measurements if available.  Informed consent was reviewed and signed by the patient, and timeout was performed at 10:00 AM. See op note below.  2. For the photodamage and solar  lentigines, sun protection discussed/information given on OTC sunscreens, and we recommend continued regular follow-up with primary dermatologist every 6 months or sooner for any growing, bleeding, or changing lesions. 3. Prognosis and future surveillance discussed. 4. Letter with treatment outcome sent to referring provider. 5. Pain acetaminophen /ibuprofen   MOHS MICROGRAPHIC SURGERY AND RECONSTRUCTION  Initial size:   0.4 x 0.4 cm Surgical defect/wound size: 0.8 x 0.8 cm Anesthesia:    0.33% lidocaine with 1:200,000 epinephrine EBL:    <5 mL Complications:  None Repair type:   Complex SQ suture:   5-0 Monocryl Cutaneous suture:  6-0 Plain gut Final size of the repair: 3.2 cm  Stages: 1  STAGE I: Anesthesia achieved with 0.5% lidocaine with 1:200,000 epinephrine. ChloraPrep applied. 1 section(s) excised using Mohs technique (this includes total peripheral and deep tissue margin excision and evaluation with frozen sections, excised and interpreted by the same physician). The tumor was first debulked and then excised with an approx. 2mm margin.  Hemostasis was achieved with electrocautery as needed.  The specimen was then oriented, subdivided/relaxed, inked, and processed using Mohs technique.    Frozen section analysis revealed a clear deep and peripheral margin.  Reconstruction  The surgical wound was then cleaned, prepped, and re-anesthetized as above. Wound edges were undermined extensively along at least one entire edge and at a distance equal to or greater than the width of the defect (see wound defect size above) in order to achieve closure and decrease wound tension and anatomic distortion. Redundant tissue repair including standing cone removal was performed. Hemostasis was achieved with electrocautery. Subcutaneous and epidermal tissues were approximated with the above sutures.  The surgical site was then lightly scrubbed with sterile, saline-soaked gauze. The area was then bandaged  using Vaseline ointment, non-adherent gauze, gauze pads, and tape to provide an adequate pressure dressing. The patient tolerated the procedure well, was given detailed written and verbal wound care instructions, and was discharged in good condition.   The patient will follow-up: 4 weeks.    Documentation: I have reviewed the above documentation for accuracy and completeness, and I agree with the above.  RUFUS CHRISTELLA HOLY, MD

## 2024-01-04 ENCOUNTER — Encounter: Payer: Self-pay | Admitting: Dermatology

## 2024-01-06 ENCOUNTER — Encounter: Payer: Self-pay | Admitting: Nurse Practitioner

## 2024-01-06 ENCOUNTER — Ambulatory Visit: Payer: Self-pay | Admitting: Nurse Practitioner

## 2024-01-06 VITALS — BP 120/70 | HR 65 | Temp 98.4°F | Ht 75.0 in | Wt 242.0 lb

## 2024-01-06 DIAGNOSIS — N401 Enlarged prostate with lower urinary tract symptoms: Secondary | ICD-10-CM

## 2024-01-06 DIAGNOSIS — G479 Sleep disorder, unspecified: Secondary | ICD-10-CM

## 2024-01-06 DIAGNOSIS — E782 Mixed hyperlipidemia: Secondary | ICD-10-CM

## 2024-01-06 DIAGNOSIS — Z87891 Personal history of nicotine dependence: Secondary | ICD-10-CM

## 2024-01-06 DIAGNOSIS — N400 Enlarged prostate without lower urinary tract symptoms: Secondary | ICD-10-CM | POA: Diagnosis not present

## 2024-01-06 DIAGNOSIS — M5136 Other intervertebral disc degeneration, lumbar region with discogenic back pain only: Secondary | ICD-10-CM | POA: Diagnosis not present

## 2024-01-06 DIAGNOSIS — Z6834 Body mass index (BMI) 34.0-34.9, adult: Secondary | ICD-10-CM | POA: Diagnosis not present

## 2024-01-06 DIAGNOSIS — F419 Anxiety disorder, unspecified: Secondary | ICD-10-CM | POA: Diagnosis not present

## 2024-01-06 DIAGNOSIS — M51369 Other intervertebral disc degeneration, lumbar region without mention of lumbar back pain or lower extremity pain: Secondary | ICD-10-CM | POA: Diagnosis not present

## 2024-01-06 DIAGNOSIS — R350 Frequency of micturition: Secondary | ICD-10-CM

## 2024-01-06 DIAGNOSIS — F32A Depression, unspecified: Secondary | ICD-10-CM

## 2024-01-06 LAB — LIPID PANEL
Chol/HDL Ratio: 4.6 ratio (ref 0.0–5.0)
Cholesterol, Total: 230 mg/dL — ABNORMAL HIGH (ref 100–199)
HDL: 50 mg/dL
LDL Chol Calc (NIH): 163 mg/dL — ABNORMAL HIGH (ref 0–99)
Triglycerides: 96 mg/dL (ref 0–149)
VLDL Cholesterol Cal: 17 mg/dL (ref 5–40)

## 2024-01-06 LAB — CMP14+EGFR
ALT: 19 IU/L (ref 0–44)
AST: 20 IU/L (ref 0–40)
Albumin: 4.4 g/dL (ref 3.8–4.9)
Alkaline Phosphatase: 57 IU/L (ref 47–123)
BUN/Creatinine Ratio: 19 (ref 10–24)
BUN: 17 mg/dL (ref 8–27)
Bilirubin Total: 0.4 mg/dL (ref 0.0–1.2)
CO2: 23 mmol/L (ref 20–29)
Calcium: 9.5 mg/dL (ref 8.6–10.2)
Chloride: 104 mmol/L (ref 96–106)
Creatinine, Ser: 0.91 mg/dL (ref 0.76–1.27)
Globulin, Total: 2.3 g/dL (ref 1.5–4.5)
Glucose: 105 mg/dL — ABNORMAL HIGH (ref 70–99)
Potassium: 4.6 mmol/L (ref 3.5–5.2)
Sodium: 139 mmol/L (ref 134–144)
Total Protein: 6.7 g/dL (ref 6.0–8.5)
eGFR: 96 mL/min/1.73

## 2024-01-06 LAB — CBC WITH DIFFERENTIAL/PLATELET
Basophils Absolute: 0.1 x10E3/uL (ref 0.0–0.2)
Basos: 1 %
EOS (ABSOLUTE): 0.2 x10E3/uL (ref 0.0–0.4)
Eos: 4 %
Hematocrit: 45 % (ref 37.5–51.0)
Hemoglobin: 14.9 g/dL (ref 13.0–17.7)
Immature Grans (Abs): 0 x10E3/uL (ref 0.0–0.1)
Immature Granulocytes: 0 %
Lymphocytes Absolute: 2.1 x10E3/uL (ref 0.7–3.1)
Lymphs: 37 %
MCH: 31 pg (ref 26.6–33.0)
MCHC: 33.1 g/dL (ref 31.5–35.7)
MCV: 94 fL (ref 79–97)
Monocytes Absolute: 0.4 x10E3/uL (ref 0.1–0.9)
Monocytes: 7 %
Neutrophils Absolute: 2.8 x10E3/uL (ref 1.4–7.0)
Neutrophils: 51 %
Platelets: 211 x10E3/uL (ref 150–450)
RBC: 4.81 x10E6/uL (ref 4.14–5.80)
RDW: 11.8 % (ref 11.6–15.4)
WBC: 5.5 x10E3/uL (ref 3.4–10.8)

## 2024-01-06 MED ORDER — ALPRAZOLAM 0.5 MG PO TABS: 0.2500 mg | ORAL_TABLET | Freq: Two times a day (BID) | ORAL | 5 refills | Status: AC | PRN

## 2024-01-06 MED ORDER — BUPROPION HCL ER (XL) 300 MG PO TB24: ORAL_TABLET | ORAL | 1 refills | Status: AC

## 2024-01-06 MED ORDER — TAMSULOSIN HCL 0.4 MG PO CAPS: 0.4000 mg | ORAL_CAPSULE | Freq: Every day | ORAL | 1 refills | Status: AC

## 2024-01-06 MED ORDER — GABAPENTIN 100 MG PO CAPS: 300.0000 mg | ORAL_CAPSULE | Freq: Two times a day (BID) | ORAL | 1 refills | Status: AC

## 2024-01-06 NOTE — Patient Instructions (Addendum)
 Lipid Profile Test Why am I having this test? The lipid profile test can be used to help evaluate your risk for developing heart disease. The test is also used to monitor your levels during treatment for high cholesterol to see if you are reaching your goals. What is being tested? A lipid profile measures the following: Total cholesterol. Cholesterol is a waxy, fat-like substance in your blood. If your total cholesterol level is high, this can increase your risk for heart disease. High-density lipoprotein (HDL). This is known as the good cholesterol. Having high levels of HDL decreases your risk for heart disease. Your HDL level may be low if you smoke or do not get enough exercise. Low-density lipoprotein (LDL). This is known as the bad cholesterol. This type causes plaque to build up in your arteries. Having a low level of LDL is best. Having high levels of LDL increases your risk for heart disease. Cholesterol to HDL ratio. This is calculated by dividing your total cholesterol by your HDL cholesterol. The ratio is used by health care providers to determine your risk for heart disease. A low ratio is best. Triglycerides. These are fats that your body can store or burn for energy. Low levels are best. Having high levels of triglycerides increases your risk for heart disease. What kind of sample is taken?  A blood sample is required for this test. It is usually collected by inserting a needle into a blood vessel. How do I prepare for this test? Follow instructions from your health care provider about changing or stopping your regular medicines. Do not eat or drink anything except water starting 9-12 hours before your test, or as long as told by your health care provider. Do not drink alcohol starting at least 24 hours before your test. Follow any instructions from your health care provider about dietary restrictions before your test. Tell a health care provider about: All medicines you are taking,  including vitamins, herbs, eye drops, creams, and over-the-counter medicines. Any medical conditions you have. Whether you are pregnant or may be pregnant. How are the results reported? Your test results will be reported as values that indicate your cholesterol and triglyceride levels. Your health care provider will compare your results to normal ranges that were established after testing a large group of people (reference ranges). Reference ranges may vary among labs and hospitals. For this test, common reference ranges are: Total cholesterol Adult or elderly: less than 200 mg/dL. Child: 120-200 mg/dL. HDL Male: greater than 45 mg/dL. Male: greater than 55 mg/dL. HDL reference values indicating your risk for heart disease: Low risk for heart disease: Male: 60 mg/dL. Male: 70 mg/dL. Moderate risk for heart disease: Male: 45 mg/dL. Male: 55 mg/dL. High risk for heart disease: Male: 25 mg/dL. Male: 35 mg/dL. LDL Adults: Your health care provider will determine a target level for LDL based on your risk for heart disease. If you are at low risk, your LDL should be 130 mg/dL or less. If you are at moderate risk, your LDL should be 100 mg/dL or less. If you are at high risk, your LDL should be 70 mg/dL or less. Children: less than 110 mg/dL. Cholesterol to HDL ratio Reference values indicating your risk for heart disease: Risk that is half the average risk: Male: 3.4. Male: 3.3. Average risk: Male: 5.0. Male: 4.4. Risk that is two times average (moderate risk): Male: 10.0. Male: 7.0. Risk that is three times average (high risk): Male: 24.0. Male: 11.0. Triglycerides Adult or  elderly: Male: 40-160 mg/dL. Male: 35-135 mg/dL. Children 56-87 years old: Male: 40-163 mg/dL. Male: 40-128 mg/dL. Children 52-58 years old: Male: 36-138 mg/dL. Male: 41-138 mg/dL. Children 60-68 years old: Male: 31-108 mg/dL. Male: 35-114 mg/dL. Children 0-5 years  old: Male: 30-86 mg/dL. Male: 32-99 mg/dL. Triglycerides should be less than 400 mg/dL even when you are not fasting. What do the results mean? Results that are within the reference ranges are considered normal. Total cholesterol, LDL, and triglyceride levels that are higher than the reference ranges can mean that you have an increased risk for heart disease. An HDL level that is lower than the reference range can also indicate an increased risk. Talk with your health care provider about what your results mean. Questions to ask your health care provider Ask your health care provider, or the department that is doing the test: When will my results be ready? How will I get my results? What are my treatment options? What other tests do I need? What are my next steps? Summary The lipid profile test can be used to help predict the likelihood that you will develop heart disease. It can also help monitor your cholesterol levels during treatment. A lipid profile measures your total cholesterol, high-density lipoprotein (HDL), low-density lipoprotein (LDL), cholesterol to HDL ratio, and triglycerides. Total cholesterol, LDL, and triglyceride levels that are higher than the reference ranges can indicate an increased risk for heart disease. An HDL level that is lower than the reference range can indicate an increased risk for heart disease. Talk with your health care provider about what your results mean. This information is not intended to replace advice given to you by your health care provider. Make sure you discuss any questions you have with your health care provider. Document Revised: 05/25/2022 Document Reviewed: 04/16/2020 Elsevier Patient Education  2024 [employer].

## 2024-01-06 NOTE — Progress Notes (Signed)
 "  Subjective:    Patient ID: Manuel Galloway, male    DOB: 28-Nov-1965, 58 y.o.   MRN: [id number]   Chief Complaint:     HPI:  Manuel Galloway is a 58 y.o. who identifies as a male who was assigned male at birth.   Social history: Lives with: girlfriend Work history: [employer]   Comes in today for follow up of the following chronic medical issues:  1. Difficulty sleeping Sleeping well lately  2. Anxiety and depression Is on combination of wellbutrin  and xanax . He says he is doing well .    01/06/2024    8:05 AM 07/05/2023    8:06 AM 01/04/2023    8:04 AM 05/20/2022    9:06 AM  GAD 7 : Generalized Anxiety Score  Nervous, Anxious, on Edge 1 1 0 1  Control/stop worrying 1 1 1 1   Worry too much - different things 1 1 1 1   Trouble relaxing 0 1 0 1  Restless 0 0 0 0  Easily annoyed or irritable 0 1 1 1   Afraid - awful might happen 0 0 0 1  Total GAD 7 Score 3 5 3 6   Anxiety Difficulty Somewhat difficult Somewhat difficult Not difficult at all Somewhat difficult       01/06/2024    8:05 AM 07/05/2023    8:05 AM 01/04/2023    8:04 AM  Depression screen PHQ 2/9  Decreased Interest 0 1 0  Down, Depressed, Hopeless 0 0 0  PHQ - 2 Score 0 1 0  Altered sleeping 0  1  Tired, decreased energy 1  1  Change in appetite 0  1  Feeling bad or failure about yourself  1  1  Trouble concentrating 0  1  Moving slowly or fidgety/restless 0  0  Suicidal thoughts 0  1  PHQ-9 Score 2  6   Difficult doing work/chores Somewhat difficult  Not difficult at all     Data saved with a previous flowsheet row definition      3. Benign prostatic hyperplasia without lower urinary tract symptoms Is on flomax  daily and denies any voiding issues. Lab Results  Component Value Date   PSA1 1.2 05/20/2022   PSA1 1.0 02/21/2019   PSA 0.8 11/02/2012      4. DDD Has a history of chronic back pain. He is on neurontin  which helps a lot with burning sensation. He denies any medication side  effects.    5. BMI 34.0-34.9,adult Weight is down 35lbs  Wt Readings from Last 3 Encounters:  01/06/24 242 lb (109.8 kg)  07/05/23 230 lb (104.3 kg)  01/04/23 265 lb (120.2 kg)   BMI Readings from Last 3 Encounters:  01/06/24 30.25 kg/m  07/05/23 28.75 kg/m  01/04/23 33.12 kg/m      Chemistry      Component Value Date/Time   NA 141 07/05/2023 0845   K 4.3 07/05/2023 0845   CL 102 07/05/2023 0845   CO2 21 07/05/2023 0845   BUN 16 07/05/2023 0845   CREATININE 1.01 07/05/2023 0845      Component Value Date/Time   CALCIUM 9.6 07/05/2023 0845   ALKPHOS 72 07/05/2023 0845   AST 20 07/05/2023 0845   ALT 21 07/05/2023 0845   BILITOT 0.8 07/05/2023 0845     The 10-year ASCVD risk score (Arnett DK, et al., 2019) is: 9%      New complaints: none  Allergies  Allergen Reactions   Fish  Allergy Shortness Of Breath and Swelling   Iodine Other (See Comments)    unkown   Outpatient Encounter Medications as of 01/06/2024  Medication Sig   acetaminophen  (TYLENOL ) 500 MG tablet Take 1,000 mg by mouth every 6 (six) hours as needed.   ALPRAZolam  (XANAX ) 0.5 MG tablet Take 0.5-1 tablets (0.25-0.5 mg total) by mouth 2 (two) times daily as needed for anxiety.   buPROPion  (WELLBUTRIN  XL) 300 MG 24 hr tablet TAKE 1 TABLET BY MOUTH EVERY DAY IN THE MORNING   fluticasone  (FLONASE ) 50 MCG/ACT nasal spray Place 2 sprays into both nostrils daily.   gabapentin  (NEURONTIN ) 100 MG capsule Take 3 capsules (300 mg total) by mouth 2 (two) times daily.   ibuprofen (ADVIL,MOTRIN) 200 MG tablet Take 200 mg by mouth every 6 (six) hours as needed.   mupirocin  ointment (BACTROBAN ) 2 % Apply 1 Application topically 2 (two) times daily.   Omega-3 Fatty Acids (FISH OIL) 1200 MG CAPS Take by mouth.   tamsulosin  (FLOMAX ) 0.4 MG CAPS capsule Take 1 capsule (0.4 mg total) by mouth daily.   valACYclovir  (VALTREX ) 1000 MG tablet Take 1 tablet (1,000 mg total) by mouth 3 (three) times daily.   No  facility-administered encounter medications on file as of 01/06/2024.    Past Surgical History:  Procedure Laterality Date   COLONOSCOPY     KNEE ARTHROSCOPY  1986 and 2007   left   KNEE ARTHROSCOPY  1999   right   MOUTH SURGERY     wisdom tooth removed    NASAL SEPTUM SURGERY  2005   REPLACEMENT TOTAL KNEE Left    TOTAL KNEE ARTHROPLASTY  07/14/2011   Procedure: TOTAL KNEE ARTHROPLASTY;  Surgeon: Dempsey LULLA Moan, MD;  Location: [facility];  Service: Orthopedics;  Laterality: Left;   TOTAL KNEE ARTHROPLASTY Right 07/16/2013   Procedure: RIGHT TOTAL KNEE ARTHROPLASTY;  Surgeon: Dempsey Moan LULLA, MD;  Location: [facility];  Service: Orthopedics;  Laterality: Right;   UPPER GASTROINTESTINAL ENDOSCOPY     UPPER GI ENDOSCOPY      Family History  Problem Relation Age of Onset   Cancer Father    Colon cancer Father 30   Rectal cancer Paternal Grandmother    Colon polyps Neg Hx    Esophageal cancer Neg Hx    Stomach cancer Neg Hx    Crohn's disease Neg Hx       Controlled substance contract: 07/05/23     Review of Systems  Constitutional:  Negative for diaphoresis.  Eyes:  Negative for pain.  Respiratory:  Negative for shortness of breath.   Cardiovascular:  Negative for chest pain, palpitations and leg swelling.  Gastrointestinal:  Negative for abdominal pain.  Endocrine: Negative for polydipsia.  Skin:  Negative for rash.  Neurological:  Negative for dizziness, weakness and headaches.  Hematological:  Does not bruise/bleed easily.  All other systems reviewed and are negative.      Objective:   Physical Exam Vitals and nursing note reviewed.  Constitutional:      Appearance: Normal appearance. He is well-developed.  HENT:     Head: Normocephalic.     Nose: Nose normal.     Mouth/Throat:     Mouth: Mucous membranes are moist.     Pharynx: Oropharynx is clear.  Eyes:     Pupils: Pupils are equal, round, and reactive to light.  Neck:     Thyroid : No thyroid  mass or  thyromegaly.     Vascular: No carotid bruit or JVD.  Trachea: Phonation normal.  Cardiovascular:     Rate and Rhythm: Normal rate and regular rhythm.  Pulmonary:     Effort: Pulmonary effort is normal. No respiratory distress.     Breath sounds: Normal breath sounds.  Abdominal:     General: Bowel sounds are normal.     Palpations: Abdomen is soft.     Tenderness: There is no abdominal tenderness.  Musculoskeletal:        General: Normal range of motion.     Cervical back: Normal range of motion and neck supple.  Lymphadenopathy:     Cervical: No cervical adenopathy.  Skin:    General: Skin is warm and dry.  Neurological:     Mental Status: He is alert and oriented to person, place, and time.  Psychiatric:        Behavior: Behavior normal.        Thought Content: Thought content normal.        Judgment: Judgment normal.     BP 120/70   Pulse 65   Temp 98.4 F (36.9 C) (Temporal)   Ht 6' 3 (1.905 m)   Wt 242 lb (109.8 kg)   SpO2 97%   BMI 30.25 kg/m          Assessment & Plan:   Manuel Galloway comes in today with chief complaint of annual physical  Diagnosis and orders addressed:  1. Difficulty sleeping (Primary) Bedtime routine  2. Anxiety and depression Stress management - buPROPion  (WELLBUTRIN  XL) 300 MG 24 hr tablet; TAKE 1 TABLET BY MOUTH EVERY DAY IN THE MORNING  Dispense: 90 tablet; Refill: 1 - ALPRAZolam  (XANAX ) 0.5 MG tablet; Take 0.5-1 tablets (0.25-0.5 mg total) by mouth 2 (two) times daily as needed for anxiety.  Dispense: 30 tablet; Refill: 2 - CBC with Differential/Platelet - CMP14+EGFR - Lipid panel  3. Benign prostatic hyperplasia without lower urinary tract symptoms Report any voiding issues  4. BMI 34.0-34.9,adult Discussed diet and exercise for person with BMI >25 Will recheck weight in 3-6 months   5. Benign prostatic hyperplasia with urinary frequency - tamsulosin  (FLOMAX ) 0.4 MG CAPS capsule; Take 1 capsule (0.4 mg total) by  mouth daily.  Dispense: 90 capsule; Refill: 1  6. DDD (degenerative disc disease), lumbar - gabapentin  (NEURONTIN ) 100 MG capsule; Take 3 capsules (300 mg total) by mouth 2 (two) times daily.  Dispense: 180 capsule; Refill: 1  7. History of smoking 25 pack year  history Low dose chest CT   8. Hyperlipidemia Low fat diet Labs pending  Labs pending Health Maintenance reviewed Diet and exercise encouraged  Follow up plan: 6 months   Mary-Margaret Gladis, FNP  "

## 2024-01-09 ENCOUNTER — Ambulatory Visit: Payer: Self-pay | Admitting: Nurse Practitioner

## 2024-01-30 ENCOUNTER — Encounter: Payer: Self-pay | Admitting: Dermatology

## 2024-01-30 ENCOUNTER — Ambulatory Visit: Admitting: Dermatology

## 2024-01-30 DIAGNOSIS — L905 Scar conditions and fibrosis of skin: Secondary | ICD-10-CM | POA: Diagnosis not present

## 2024-01-30 DIAGNOSIS — Z85828 Personal history of other malignant neoplasm of skin: Secondary | ICD-10-CM | POA: Diagnosis not present

## 2024-01-30 DIAGNOSIS — T8131XA Disruption of external operation (surgical) wound, not elsewhere classified, initial encounter: Secondary | ICD-10-CM

## 2024-01-30 NOTE — Progress Notes (Signed)
" ° °  Follow Up Visit   Subjective  Manuel Galloway is a 59 y.o. male who presents for the following: follow up from Mohs surgery   The patient presents for follow up from Mohs surgery for a BCC on the left malar cheek, treated on 12/28/23, repaired with linear closure. The patient has been bandaging the wound as directed. The endorse the following concerns: none  The following portions of the chart were reviewed this encounter and updated as appropriate: medications, allergies, medical history  Review of Systems:  No other skin or systemic complaints except as noted in HPI or Assessment and Plan.  Objective  Well appearing patient in no apparent distress; mood and affect are within normal limits.  A focal examination was performed including scalp, head, face. All findings within normal limits unless otherwise noted below.  Healing wound with mild erythema  Relevant physical exam findings are noted in the Assessment and Plan.    Assessment & Plan   Scar with spitting sutures s/p Mohs for [facility] on the left cheek, treated on 12/28/23, repaired with linear closure - Reassured that wound is healing well - Spitting sutures noted, recommend warm compresses - No evidence of infection - No swelling, induration, purulence, dehiscence, or tenderness out of proportion to the clinical exam, see photo above - Discussed that scars take up to 12 months to mature from the date of surgery - Recommend SPF 30+ to scar daily to prevent purple color from UV exposure during scar maturation process - Discussed that erythema and raised appearance of scar will fade over the next 4-6 months - OK to start scar massage at 4-6 weeks post-op - Can consider silicone based products for scar healing starting at 6 weeks post-op - Ok to continue ointment daily to wound under a bandage for another week  HISTORY OF BASAL CELL CARCINOMA OF THE SKIN - No evidence of recurrence today - Recommend regular full body skin exams -  Recommend daily broad spectrum sunscreen SPF 30+ to sun-exposed areas, reapply every 2 hours as needed.  - Call if any new or changing lesions are noted between office visits'  Return if symptoms worsen or fail to improve.  I, Manuel Galloway, CMA, am acting as scribe for RUFUS CHRISTELLA HOLY, MD.   Documentation: I have reviewed the above documentation for accuracy and completeness, and I agree with the above.  RUFUS CHRISTELLA HOLY, MD  "

## 2024-01-30 NOTE — Patient Instructions (Signed)

## 2024-02-16 ENCOUNTER — Encounter: Payer: Self-pay | Admitting: Emergency Medicine

## 2024-07-06 ENCOUNTER — Ambulatory Visit: Admitting: Nurse Practitioner
